# Patient Record
Sex: Female | Born: 1990 | Race: Black or African American | Hispanic: No | Smoking: Former smoker
Health system: Southern US, Community
[De-identification: ages and names within clinical notes are randomized; demographics above are authoritative.]

## PROBLEM LIST (undated history)

## (undated) DIAGNOSIS — Q249 Congenital malformation of heart, unspecified: Secondary | ICD-10-CM

## (undated) DIAGNOSIS — F419 Anxiety disorder, unspecified: Secondary | ICD-10-CM

## (undated) HISTORY — PX: OTHER SURGICAL HISTORY: SHX169

## (undated) NOTE — *Deleted (*Deleted)
  [facility]   MRN: [id number] DOB: Jun 01, 1990  Subjective:   Alison Reyes is a 59 y.o. female presenting for ***  No current facility-administered medications for this encounter.  Current Outpatient Medications:  .  cyclobenzaprine (FLEXERIL) 10 MG tablet, Take 1 tablet (10 mg total) by mouth 2 (two) times daily as needed for muscle spasms., Disp: 20 tablet, Rfl: 0 .  diphenhydrAMINE (BENADRYL) 25 MG tablet, Take 25 mg by mouth as needed (for allergic reactions). , Disp: , Rfl:  .  hydrocortisone cream 1 %, Apply 1 application topically as needed for itching., Disp: , Rfl:  .  hydrOXYzine (ATARAX/VISTARIL) 25 MG tablet, Take 0.5-1 tablets (12.5-25 mg total) by mouth every 8 (eight) hours as needed for itching., Disp: 30 tablet, Rfl: 0 .  ibuprofen (ADVIL,MOTRIN) 200 MG tablet, Take 600 mg by mouth every 8 (eight) hours as needed for moderate pain. , Disp: , Rfl:  .  ketoconazole (NIZORAL) 2 % cream, Apply 1 application topically daily., Disp: 30 g, Rfl: 0 .  methocarbamol (ROBAXIN) 500 MG tablet, Take 1 tablet (500 mg total) by mouth 2 (two) times daily., Disp: 20 tablet, Rfl: 0 .  naproxen (NAPROSYN) 500 MG tablet, Take 1 tablet (500 mg total) by mouth 2 (two) times daily with a meal. As needed for pain, Disp: 20 tablet, Rfl: 0 .  olopatadine (PATANOL) 0.1 % ophthalmic solution, Place 1 drop into the left eye 2 (two) times daily as needed for allergies (itching)., Disp: 5 mL, Rfl: 12 .  ondansetron (ZOFRAN-ODT) 8 MG disintegrating tablet, Take 1 tablet (8 mg total) by mouth every 8 (eight) hours as needed for nausea or vomiting., Disp: 15 tablet, Rfl: 0 .  polyethylene glycol powder (MIRALAX) 17 GM/SCOOP powder, Use 1-2 capfuls in 8 ounces of liquid up to 3 times a day until you are having regular bowel movements., Disp: 255 g, Rfl: 0 .  trimethoprim-polymyxin b (POLYTRIM) ophthalmic solution, Place 1-2 drops into the left eye every 6 (six) hours., Disp: 10 mL, Rfl:  0   Allergies  Allergen Reactions  . Benzocaine Anaphylaxis and Swelling    Throat swells  Throat swells  . Benzocaine-Benzalkonium Cl Swelling  . Carbamide Peroxide Swelling  . Prednisone Hives  . Nickel Rash    Past Medical History:  Diagnosis Date  . Anxiety      No past surgical history on file.  Family History  Problem Relation Age of Onset  . Diabetes Maternal Grandmother   . Diabetes Paternal Grandmother   . Diabetes Maternal Aunt   . Healthy Mother   . Healthy Father     Social History   Tobacco Use  . Smoking status: Light Tobacco Smoker    Types: Cigarettes  . Smokeless tobacco: Never Used  Substance Use Topics  . Alcohol use: Not Currently  . Drug use: Yes    Types: Marijuana    Comment: vicodin    ROS   Objective:   Vitals: There were no vitals taken for this visit.  Physical Exam  No results found for this or any previous visit (from the past 24 hour(s)).  Assessment and Plan :   PDMP not reviewed this encounter.  No diagnosis found.

---
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
## 2014-06-17 ENCOUNTER — Telehealth: Payer: Self-pay | Admitting: *Deleted

## 2014-06-17 NOTE — Telephone Encounter (Signed)
Error

## 2014-12-26 ENCOUNTER — Encounter (HOSPITAL_COMMUNITY): Payer: Self-pay | Admitting: *Deleted

## 2014-12-26 ENCOUNTER — Emergency Department (HOSPITAL_COMMUNITY)
Admission: EM | Admit: 2014-12-26 | Discharge: 2014-12-26 | Disposition: A | Discharge status: Home / Self Care | Payer: Self-pay | Attending: Emergency Medicine | Admitting: Emergency Medicine

## 2014-12-26 DIAGNOSIS — Z720 Tobacco use: Secondary | ICD-10-CM | POA: Insufficient documentation

## 2014-12-26 DIAGNOSIS — L03211 Cellulitis of face: Secondary | ICD-10-CM

## 2014-12-26 DIAGNOSIS — K130 Diseases of lips: Secondary | ICD-10-CM | POA: Insufficient documentation

## 2014-12-26 DIAGNOSIS — R634 Abnormal weight loss: Secondary | ICD-10-CM | POA: Insufficient documentation

## 2014-12-26 DIAGNOSIS — R220 Localized swelling, mass and lump, head: Secondary | ICD-10-CM

## 2014-12-26 LAB — CBG MONITORING, ED: Glucose-Capillary: 92 mg/dL (ref 65–99)

## 2014-12-26 MED ORDER — NAPROXEN 500 MG PO TABS: 500.0000 mg | ORAL_TABLET | Freq: Two times a day (BID) | ORAL | Status: DC | PRN

## 2014-12-26 MED ORDER — CEPHALEXIN 500 MG PO CAPS: ORAL_CAPSULE | ORAL | Status: DC

## 2014-12-26 MED ORDER — SULFAMETHOXAZOLE-TRIMETHOPRIM 800-160 MG PO TABS: 1.0000 | ORAL_TABLET | Freq: Two times a day (BID) | ORAL | Status: DC

## 2014-12-26 NOTE — Discharge Instructions (Signed)
Stop picking at your facial acne/white heads. Use a exfoliant face scrub to help with acne. Keep wound clean and dry. Apply warm compresses to affected area throughout the day, and use ice to help with pain/swelling. Take antibiotics until it is finished. Take naprosyn and tylenol as directed, as needed for pain. STOP SMOKING! Followup with [facility] in 2-3 days for wound recheck and ongoing medical care. Monitor area for signs of infection to include, but not limited to: increasing pain, spreading redness, drainage/pus, worsening swelling, or fevers. Return to emergency department for emergent changing or worsening symptoms.   Cellulitis Cellulitis is an infection of the skin and the tissue beneath it. The infected area is usually red and tender. Cellulitis occurs most often in the arms and lower legs.  CAUSES  Cellulitis is caused by bacteria that enter the skin through cracks or cuts in the skin. The most common types of bacteria that cause cellulitis are staphylococci and streptococci. SIGNS AND SYMPTOMS   Redness and warmth.  Swelling.  Tenderness or pain.  Fever. DIAGNOSIS  Your health care provider can usually determine what is wrong based on a physical exam. Blood tests may also be done. TREATMENT  Treatment usually involves taking an antibiotic medicine. HOME CARE INSTRUCTIONS   Take your antibiotic medicine as directed by your health care provider. Finish the antibiotic even if you start to feel better.  Keep the infected arm or leg elevated to reduce swelling.  Apply a warm cloth to the affected area up to 4 times per day to relieve pain.  Take medicines only as directed by your health care provider.  Keep all follow-up visits as directed by your health care provider. SEEK MEDICAL CARE IF:   You notice red streaks coming from the infected area.  Your red area gets larger or turns dark in color.  Your bone or joint underneath the infected  area becomes painful after the skin has healed.  Your infection returns in the same area or another area.  You notice a swollen bump in the infected area.  You develop new symptoms.  You have a fever. SEEK IMMEDIATE MEDICAL CARE IF:   You feel very sleepy.  You develop vomiting or diarrhea.  You have a general ill feeling (malaise) with muscle aches and pains.   This information is not intended to replace advice given to you by your health care provider. Make sure you discuss any questions you have with your health care provider.   Document Released: 11/25/2004 Document Revised: 11/06/2014 Document Reviewed: 05/03/2011 Elsevier Interactive Patient Education 2016 [employer].  Smoking Cessation, Tips for Success If you are ready to quit smoking, congratulations! You have chosen to help yourself be healthier. Cigarettes bring nicotine, tar, carbon monoxide, and other irritants into your body. Your lungs, heart, and blood vessels will be able to work better without these poisons. There are many different ways to quit smoking. Nicotine gum, nicotine patches, a nicotine inhaler, or nicotine nasal spray can help with physical craving. Hypnosis, support groups, and medicines help break the habit of smoking. WHAT THINGS CAN I DO TO MAKE QUITTING EASIER?  Here are some tips to help you quit for good:  Pick a date when you will quit smoking completely. Tell all of your friends and family about your plan to quit on that date.  Do not try to slowly cut down on the number of cigarettes you are smoking. Pick a quit date and quit smoking  completely starting on that day.  Throw away all cigarettes.   Clean and remove all ashtrays from your home, work, and car.  On a card, write down your reasons for quitting. Carry the card with you and read it when you get the urge to smoke.  Cleanse your body of nicotine. Drink enough water and fluids to keep your urine clear or pale yellow. Do this after  quitting to flush the nicotine from your body.  Learn to predict your moods. Do not let a bad situation be your excuse to have a cigarette. Some situations in your life might tempt you into wanting a cigarette.  Never have "just one" cigarette. It leads to wanting another and another. Remind yourself of your decision to quit.  Change habits associated with smoking. If you smoked while driving or when feeling stressed, try other activities to replace smoking. Stand up when drinking your coffee. Brush your teeth after eating. Sit in a different chair when you read the paper. Avoid alcohol while trying to quit, and try to drink fewer caffeinated beverages. Alcohol and caffeine may urge you to smoke.  Avoid foods and drinks that can trigger a desire to smoke, such as sugary or spicy foods and alcohol.  Ask people who smoke not to smoke around you.  Have something planned to do right after eating or having a cup of coffee. For example, plan to take a walk or exercise.  Try a relaxation exercise to calm you down and decrease your stress. Remember, you may be tense and nervous for the first 2 weeks after you quit, but this will pass.  Find new activities to keep your hands busy. Play with a pen, coin, or rubber band. Doodle or draw things on paper.  Brush your teeth right after eating. This will help cut down on the craving for the taste of tobacco after meals. You can also try mouthwash.   Use oral substitutes in place of cigarettes. Try using lemon drops, carrots, cinnamon sticks, or chewing gum. Keep them handy so they are available when you have the urge to smoke.  When you have the urge to smoke, try deep breathing.  Designate your home as a nonsmoking area.  If you are a heavy smoker, ask your health care provider about a prescription for nicotine chewing gum. It can ease your withdrawal from nicotine.  Reward yourself. Set aside the cigarette money you save and buy yourself something  nice.  Look for support from others. Join a support group or smoking cessation program. Ask someone at home or at work to help you with your plan to quit smoking.  Always ask yourself, "Do I need this cigarette or is this just a reflex?" Tell yourself, "Today, I choose not to smoke," or "I do not want to smoke." You are reminding yourself of your decision to quit.  Do not replace cigarette smoking with electronic cigarettes (commonly called e-cigarettes). The safety of e-cigarettes is unknown, and some may contain harmful chemicals.  If you relapse, do not give up! Plan ahead and think about what you will do the next time you get the urge to smoke. HOW WILL I FEEL WHEN I QUIT SMOKING? You may have symptoms of withdrawal because your body is used to nicotine (the addictive substance in cigarettes). You may crave cigarettes, be irritable, feel very hungry, cough often, get headaches, or have difficulty concentrating. The withdrawal symptoms are only temporary. They are strongest when you first quit but will  go away within 10-14 days. When withdrawal symptoms occur, stay in control. Think about your reasons for quitting. Remind yourself that these are signs that your body is healing and getting used to being without cigarettes. Remember that withdrawal symptoms are easier to treat than the major diseases that smoking can cause.  Even after the withdrawal is over, expect periodic urges to smoke. However, these cravings are generally short lived and will go away whether you smoke or not. Do not smoke! WHAT RESOURCES ARE AVAILABLE TO HELP ME QUIT SMOKING? Your health care provider can direct you to community resources or hospitals for support, which may include:  Group support.  Education.  Hypnosis.  Therapy.   This information is not intended to replace advice given to you by your health care provider. Make sure you discuss any questions you have with your health care provider.   Document  Released: 11/14/2003 Document Revised: 03/08/2014 Document Reviewed: 08/03/2012 Elsevier Interactive Patient Education [employer] Elsevier Inc.

## 2014-12-26 NOTE — ED Provider Notes (Signed)
CSN: [id number]     Arrival date & time 12/26/14  4098 History   First MD Initiated Contact with Patient 12/26/14 0827     Chief Complaint  Patient presents with  . Lip Infection      (Consider location/radiation/quality/duration/timing/severity/associated sxs/prior Treatment) HPI Comments: Alison Reyes is a 24 y.o. otherwise healthy female, who presents to the ED with complaints of left lip swelling 2 days with associated pain, erythema, warmth, and yellow drainage. She reports that she gets whiteheads on her lower lip and chin, and regularly picks at them, and 2 days ago one of the areas began to swell. She describes the pain is 8/10 constant throbbing nonradiating pain worse with touching the area and unrelieved with ibuprofen. She states she is concerned that maybe she has diabetes because her family members have diabetes and when she gets a cut or sore it takes a long time to heal, as well as having a 20 pound weight loss in the last year. She denies any fevers, chest pain, shortness breath, difficulty swallowing, drooling, and, drainage or dental pain, neck pain or swelling, abdominal pain, nausea, vomiting, diarrhea constipation, dysuria, hematuria, numbness, tingling, polydipsia, or polyuria.  Patient is a 24 y.o. female presenting with abscess. The history is provided by the patient. No language interpreter was used.  Abscess Location:  Face Facial abscess location:  Lip Abscess quality: draining, painful, redness and warmth   Red streaking: no   Duration:  2 days Progression:  Unchanged Pain details:    Quality:  Throbbing   Severity:  Moderate   Duration:  2 days   Timing:  Constant   Progression:  Unchanged Chronicity:  Recurrent Context: skin injury   Relieved by:  Nothing Worsened by:  Draining/squeezing Ineffective treatments:  NSAIDs Associated symptoms: no fever, no nausea and no vomiting     History reviewed. No pertinent past medical history. History  reviewed. No pertinent past surgical history. Family History  Problem Relation Age of Onset  . Diabetes Maternal Grandmother   . Diabetes Paternal Grandmother   . Diabetes Maternal Aunt    Social History  Substance Use Topics  . Smoking status: Light Tobacco Smoker  . Smokeless tobacco: None  . Alcohol Use: No   OB History    No data available     Review of Systems  Constitutional: Positive for unexpected weight change (lost 20# in 8yr). Negative for fever and chills.  HENT: Positive for facial swelling (lip bumps). Negative for dental problem and drooling.   Respiratory: Negative for shortness of breath.   Cardiovascular: Negative for chest pain.  Gastrointestinal: Negative for nausea, vomiting, abdominal pain, diarrhea and constipation.  Endocrine: Negative for polydipsia and polyuria.  Genitourinary: Negative for dysuria, hematuria, vaginal bleeding and vaginal discharge.  Musculoskeletal: Negative for myalgias, arthralgias and neck pain.  Skin: Positive for wound.  Allergic/Immunologic: Negative for immunocompromised state.  Neurological: Negative for weakness and numbness.  Psychiatric/Behavioral: Negative for confusion.   10 Systems reviewed and are negative for acute change except as noted in the HPI.    Allergies  Review of patient's allergies indicates no known allergies.  Home Medications   Prior to Admission medications   Not on File   BP 128/60 mmHg  Pulse 79  Temp(Src) 98.2 F (36.8 C) (Oral)  SpO2 100%  LMP 12/24/2014 (Exact Date) Physical Exam  Constitutional: She is oriented to person, place, and time. Vital signs are normal. She appears well-developed and well-nourished.  Non-toxic appearance. No  distress.  Afebrile, nontoxic, NAD  HENT:  Head: Normocephalic and atraumatic.  Mouth/Throat: Uvula is midline, oropharynx is clear and moist and mucous membranes are normal. Oral lesions present. No trismus in the jaw. No dental abscesses or uvula  swelling.    ~144mm circular sore to L lower lip with mild swelling, tenderness, and slight induration without fluctuance, erythema, or warmth. Several white heads to chin. No drainage from sore. No trismus. Dentitia without abscess.   Eyes: Conjunctivae and EOM are normal. Right eye exhibits no discharge. Left eye exhibits no discharge.  Neck: Normal range of motion. Neck supple.  Cardiovascular: Normal rate.   Pulmonary/Chest: Effort normal. No respiratory distress.  Abdominal: Normal appearance. She exhibits no distension.  Musculoskeletal: Normal range of motion.  Lymphadenopathy:       Head (right side): No submandibular and no tonsillar adenopathy present.       Head (left side): Submandibular adenopathy present. No tonsillar adenopathy present.    She has cervical adenopathy.  Shotty cervical LAD which is nonTTP, slightly tender L submandibular reactive LAD  Neurological: She is alert and oriented to person, place, and time. She has normal strength. No sensory deficit.  Skin: Skin is warm, dry and intact. Lesion noted. No rash noted.  L lower lip sore as noted above  Psychiatric: She has a normal mood and affect. Her behavior is normal.  Nursing note and vitals reviewed.   ED Course  Procedures (including critical care time) Labs Review Labs Reviewed  CBG MONITORING, ED    Imaging Review No results found. I have personally reviewed and evaluated these images and lab results as part of my medical decision-making.   EKG Interpretation None      MDM   Final diagnoses:  Lip pain  Lip swelling  Cellulitis of face  Weight loss  Tobacco abuse    24 y.o. female here with L lower lip infection after she repeatedly picked at a white head that she perpetually has on her chin. Area without erythema or warmth, no fluctuance, mild induration and swelling and tenderness, appears to be a sore that is beginning to be secondarily infected. Doubt need for I&D. Will start on abx, and  have her f/up with [facility]. Pt also concerned that she could have diabetes due to wt loss over the last yr, no polyuria/polydipsia, will check CBG but discussed that she would need formal hgb A1C testing to definitively diagnose this in the event that it's an equivocal number.  8:53 AM CBG 92 (fasting). Tylenol/motrin for pain, ice discussed, smoking cessation discussed. Keflex/bactrim rx given. F/up with [facility] in 3 days. I explained the diagnosis and have given explicit precautions to return to the ER including for any other new or worsening symptoms. The patient understands and accepts the medical plan as it's been dictated and I have answered their questions. Discharge instructions concerning home care and prescriptions have been given. The patient is STABLE and is discharged to home in good condition.  BP 128/60 mmHg  Pulse 79  Temp(Src) 98.2 F (36.8 C) (Oral)  Ht 5\' 6"  (1.676 m)  Wt 131 lb (59.421 kg)  BMI 21.15 kg/m2  SpO2 100%  LMP 12/24/2014 (Exact Date)  Meds ordered this encounter  Medications  . cephALEXin (KEFLEX) 500 MG capsule    Sig: 2 caps po bid x 7 days    Dispense:  28 capsule    Refill:  0    Order Specific Question:  Supervising Provider  Answer:  MILLER, BRIAN [[id number]]  . sulfamethoxazole-trimethoprim (BACTRIM DS,SEPTRA DS) 800-160 MG tablet    Sig: Take 1 tablet by mouth 2 (two) times daily.    Dispense:  14 tablet    Refill:  0    Order Specific Question:  Supervising Provider    Answer:  MILLER, BRIAN [[id number]]  . naproxen (NAPROSYN) 500 MG tablet    Sig: Take 1 tablet (500 mg total) by mouth 2 (two) times daily as needed for mild pain, moderate pain or headache (TAKE WITH MEALS.).    Dispense:  20 tablet    Refill:  0    Order Specific Question:  Supervising Provider    Answer:  Eber Hong [address] [place], PA-C 12/26/14 6045  Nelva Nay, MD 12/27/14 [phone]

## 2014-12-26 NOTE — ED Notes (Signed)
PA at bedside.

## 2014-12-26 NOTE — ED Notes (Signed)
Pt reports she had acne on around her lower lip. Pt stuck 2 places on lip edge with sewing needle she sterilized 2 days ago. Now has a white sore on left bottom lip. Smaller sore on right bottom lip. Pain 8/10. Denies fever. Some redness around lip site, pt just pulled off 2 bandaids from left wound area. Some swelling noted to left lower lip.   Pt is concerned she has diabetes, maternal grandmother, paternal grandmother, maternal aunt all have diabetes. Pt thinks this because cuts/sores take a long time to heal and pt has lost 20 pounds in the last year without trying. Went from 154 pounds to 130 pounds.

## 2017-11-05 ENCOUNTER — Emergency Department (HOSPITAL_COMMUNITY)
Admission: EM | Admit: 2017-11-05 | Discharge: 2017-11-05 | Disposition: A | Discharge status: Home / Self Care | Payer: Self-pay | Attending: Emergency Medicine | Admitting: Emergency Medicine

## 2017-11-05 ENCOUNTER — Encounter (HOSPITAL_COMMUNITY): Payer: Self-pay | Admitting: Emergency Medicine

## 2017-11-05 ENCOUNTER — Emergency Department (HOSPITAL_COMMUNITY): Payer: Self-pay

## 2017-11-05 ENCOUNTER — Other Ambulatory Visit: Payer: Self-pay

## 2017-11-05 DIAGNOSIS — F17210 Nicotine dependence, cigarettes, uncomplicated: Secondary | ICD-10-CM | POA: Insufficient documentation

## 2017-11-05 DIAGNOSIS — R55 Syncope and collapse: Secondary | ICD-10-CM | POA: Insufficient documentation

## 2017-11-05 DIAGNOSIS — Z7982 Long term (current) use of aspirin: Secondary | ICD-10-CM | POA: Insufficient documentation

## 2017-11-05 DIAGNOSIS — Z79899 Other long term (current) drug therapy: Secondary | ICD-10-CM | POA: Insufficient documentation

## 2017-11-05 LAB — I-STAT CHEM 8, ED
BUN: 20 mg/dL (ref 6–20)
Calcium, Ion: 1.04 mmol/L — ABNORMAL LOW (ref 1.15–1.40)
Chloride: 106 mmol/L (ref 98–111)
Creatinine, Ser: 0.7 mg/dL (ref 0.44–1.00)
Glucose, Bld: 111 mg/dL — ABNORMAL HIGH (ref 70–99)
HCT: 42 % (ref 36.0–46.0)
Hemoglobin: 14.3 g/dL (ref 12.0–15.0)
Potassium: 5.2 mmol/L — ABNORMAL HIGH (ref 3.5–5.1)
Sodium: 138 mmol/L (ref 135–145)
TCO2: 24 mmol/L (ref 22–32)

## 2017-11-05 LAB — I-STAT BETA HCG BLOOD, ED (MC, WL, AP ONLY): I-stat hCG, quantitative: <5 m[IU]/mL (ref <5)

## 2017-11-05 LAB — ETHANOL: Alcohol, Ethyl (B): 23 mg/dL — ABNORMAL HIGH (ref <10)

## 2017-11-05 MED ORDER — LORAZEPAM 2 MG/ML IJ SOLN
1.0000 mg | Freq: Once | INTRAMUSCULAR | Status: AC | PRN
  Administered 2017-11-05: 1 mg via INTRAVENOUS
  Filled 2017-11-05: qty 1

## 2017-11-05 MED ORDER — SODIUM CHLORIDE 0.9 % IV BOLUS
1000.0000 mL | Freq: Once | INTRAVENOUS | Status: AC
  Administered 2017-11-05: 1000 mL via INTRAVENOUS

## 2017-11-05 MED ORDER — GADOBUTROL 1 MMOL/ML IV SOLN
7.5000 mL | Freq: Once | INTRAVENOUS | Status: AC | PRN
  Administered 2017-11-05: 6 mL via INTRAVENOUS

## 2017-11-05 MED ORDER — METOCLOPRAMIDE HCL 5 MG/ML IJ SOLN
10.0000 mg | Freq: Once | INTRAMUSCULAR | Status: AC
  Administered 2017-11-05: 10 mg via INTRAVENOUS
  Filled 2017-11-05: qty 2
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
## 2017-11-05 NOTE — Discharge Instructions (Addendum)
Do not drive until cleared by Cardiology.  Drink plenty of fluids. Your studies today were re-assuring.

## 2017-11-05 NOTE — ED Notes (Signed)
Patient transported to MRI 

## 2017-11-05 NOTE — ED Triage Notes (Signed)
EMS  20g in rt. Wrist , given Zofran 4 mg. IV .

## 2018-04-13 ENCOUNTER — Emergency Department (HOSPITAL_COMMUNITY)
Admission: EM | Admit: 2018-04-13 | Discharge: 2018-04-14 | Disposition: A | Discharge status: Home / Self Care | Payer: Self-pay | Attending: Emergency Medicine | Admitting: Emergency Medicine

## 2018-04-13 ENCOUNTER — Encounter (HOSPITAL_COMMUNITY): Payer: Self-pay | Admitting: Emergency Medicine

## 2018-04-13 DIAGNOSIS — T424X1A Poisoning by benzodiazepines, accidental (unintentional), initial encounter: Secondary | ICD-10-CM | POA: Insufficient documentation

## 2018-04-13 DIAGNOSIS — F17210 Nicotine dependence, cigarettes, uncomplicated: Secondary | ICD-10-CM | POA: Insufficient documentation

## 2018-04-13 DIAGNOSIS — T50901A Poisoning by unspecified drugs, medicaments and biological substances, accidental (unintentional), initial encounter: Secondary | ICD-10-CM

## 2018-04-13 DIAGNOSIS — Z7982 Long term (current) use of aspirin: Secondary | ICD-10-CM | POA: Insufficient documentation

## 2018-04-13 DIAGNOSIS — Z79899 Other long term (current) drug therapy: Secondary | ICD-10-CM | POA: Insufficient documentation

## 2018-04-13 HISTORY — DX: Anxiety disorder, unspecified: F41.9

## 2018-04-13 LAB — CBC WITH DIFFERENTIAL/PLATELET
ABS IMMATURE GRANULOCYTES: 0.1 10*3/uL — AB (ref 0.00–0.07)
Basophils Absolute: 0.1 10*3/uL (ref 0.0–0.1)
Basophils Relative: 0 %
Eosinophils Absolute: 0 10*3/uL (ref 0.0–0.5)
Eosinophils Relative: 0 %
HCT: 41.7 % (ref 36.0–46.0)
HEMOGLOBIN: 13.8 g/dL (ref 12.0–15.0)
Immature Granulocytes: 1 %
Lymphocytes Relative: 20 %
Lymphs Abs: 3.3 10*3/uL (ref 0.7–4.0)
MCH: 32.5 pg (ref 26.0–34.0)
MCHC: 33.1 g/dL (ref 30.0–36.0)
MCV: 98.1 fL (ref 80.0–100.0)
Monocytes Absolute: 0.9 10*3/uL (ref 0.1–1.0)
Monocytes Relative: 5 %
Neutro Abs: 12.2 10*3/uL — ABNORMAL HIGH (ref 1.7–7.7)
Neutrophils Relative %: 74 %
Platelets: 357 10*3/uL (ref 150–400)
RBC: 4.25 MIL/uL (ref 3.87–5.11)
RDW: 11.6 % (ref 11.5–15.5)
WBC: 16.6 10*3/uL — ABNORMAL HIGH (ref 4.0–10.5)
nRBC: 0 % (ref 0.0–0.2)

## 2018-04-13 LAB — COMPREHENSIVE METABOLIC PANEL
ALK PHOS: 41 U/L (ref 38–126)
ALT: 34 U/L (ref 0–44)
AST: 53 U/L — ABNORMAL HIGH (ref 15–41)
Albumin: 4.5 g/dL (ref 3.5–5.0)
Anion gap: 20 — ABNORMAL HIGH (ref 5–15)
BUN: 12 mg/dL (ref 6–20)
CO2: 19 mmol/L — AB (ref 22–32)
Calcium: 9 mg/dL (ref 8.9–10.3)
Chloride: 100 mmol/L (ref 98–111)
Creatinine, Ser: 1.23 mg/dL — ABNORMAL HIGH (ref 0.44–1.00)
GFR calc Af Amer: >60 mL/min (ref >60)
GFR calc non Af Amer: >60 mL/min (ref >60)
Glucose, Bld: 197 mg/dL — ABNORMAL HIGH (ref 70–99)
Potassium: 3.3 mmol/L — ABNORMAL LOW (ref 3.5–5.1)
Sodium: 139 mmol/L (ref 135–145)
Total Bilirubin: 1.2 mg/dL (ref 0.3–1.2)
Total Protein: 8 g/dL (ref 6.5–8.1)

## 2018-04-13 LAB — SALICYLATE LEVEL: Salicylate Lvl: <7 mg/dL (ref 2.8–30.0)

## 2018-04-13 LAB — ETHANOL: Alcohol, Ethyl (B): <10 mg/dL (ref <10)

## 2018-04-13 LAB — I-STAT BETA HCG BLOOD, ED (MC, WL, AP ONLY): I-stat hCG, quantitative: <5 m[IU]/mL (ref <5)

## 2018-04-13 LAB — RAPID URINE DRUG SCREEN, HOSP PERFORMED
AMPHETAMINES: NOT DETECTED
Barbiturates: NOT DETECTED
Benzodiazepines: POSITIVE — AB
Cocaine: NOT DETECTED
Opiates: NOT DETECTED
Tetrahydrocannabinol: POSITIVE — AB

## 2018-04-13 LAB — ACETAMINOPHEN LEVEL: Acetaminophen (Tylenol), Serum: <10 ug/mL — ABNORMAL LOW (ref 10–30)

## 2018-04-13 MED ORDER — ONDANSETRON HCL 4 MG/2ML IJ SOLN
4.0000 mg | Freq: Once | INTRAMUSCULAR | Status: AC
  Administered 2018-04-14: 4 mg via INTRAVENOUS
  Filled 2018-04-13: qty 2

## 2018-04-13 MED ORDER — LACTATED RINGERS IV BOLUS
1000.0000 mL | Freq: Once | INTRAVENOUS | Status: AC
  Administered 2018-04-13: 1000 mL via INTRAVENOUS

## 2018-04-13 MED ORDER — ONDANSETRON HCL 4 MG/2ML IJ SOLN
4.0000 mg | Freq: Once | INTRAMUSCULAR | Status: AC
  Administered 2018-04-13: 4 mg via INTRAVENOUS
  Filled 2018-04-13: qty 2

## 2018-04-13 MED ORDER — NALOXONE HCL 0.4 MG/ML IJ SOLN: 0.4000 mg | INTRAMUSCULAR | Status: DC | PRN

## 2018-04-13 NOTE — ED Notes (Signed)
EDP explained tests results and plan of care to pt.  
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
## 2018-04-13 NOTE — ED Provider Notes (Signed)
[facility] EMERGENCY DEPARTMENT Provider Note   CSN: [id number] Arrival date & time: 04/13/18  2034     History   Chief Complaint Chief Complaint  Patient presents with  . Possible Xanax Overdose    HPI Alison Reyes is a 28 y.o. female.   Drug Overdose  This is a new problem. The current episode started less than 1 hour ago. The problem occurs rarely. The problem has been resolved. Pertinent negatives include no chest pain, no abdominal pain, no headaches and no shortness of breath. Nothing aggravates the symptoms. Relieved by: Narcan 2 mg. She has tried nothing for the symptoms. The treatment provided no relief.    Past Medical History:  Diagnosis Date  . Anxiety     There are no active problems to display for this patient.   History reviewed. No pertinent surgical history.   OB History   No obstetric history on file.      Home Medications    Prior to Admission medications   Medication Sig Start Date End Date Taking? Authorizing Provider  aspirin EC 325 MG tablet Take 325 mg by mouth daily.   Yes [provider]  diphenhydrAMINE (BENADRYL) 25 MG tablet Take 25 mg by mouth as needed (for allergic reactions).    Yes [provider]  hydrocortisone cream 1 % Apply 1 application topically as needed for itching.   Yes [provider]  aspirin EC 81 MG tablet Take 81 mg by mouth daily.    [provider]  cephALEXin (KEFLEX) 500 MG capsule 2 caps po bid x 7 days Patient not taking: Reported on 04/13/2018 12/26/14   Street, [place], PA-C  ibuprofen (ADVIL,MOTRIN) 200 MG tablet Take 600 mg by mouth every 8 (eight) hours as needed for moderate pain.     [provider]  naproxen (NAPROSYN) 500 MG tablet Take 1 tablet (500 mg total) by mouth 2 (two) times daily as needed for mild pain, moderate pain or headache (TAKE WITH MEALS.). Patient not taking: Reported on 04/13/2018 12/26/14   Street, [place],  PA-C  sulfamethoxazole-trimethoprim (BACTRIM DS,SEPTRA DS) 800-160 MG tablet Take 1 tablet by mouth 2 (two) times daily. Patient not taking: Reported on 04/13/2018 12/26/14   Street, [place], PA-C    Family History Family History  Problem Relation Age of Onset  . Diabetes Maternal Grandmother   . Diabetes Paternal Grandmother   . Diabetes Maternal Aunt     Social History Social History   Tobacco Use  . Smoking status: Light Tobacco Smoker  . Smokeless tobacco: Never Used  Substance Use Topics  . Alcohol use: No  . Drug use: No     Allergies   Benzocaine; Carbamide peroxide; Prednisone; and Nickel   Review of Systems Review of Systems  Constitutional: Negative for chills and fever.  HENT: Negative for ear pain and sore throat.   Eyes: Negative for pain and visual disturbance.  Respiratory: Negative for cough and shortness of breath.   Cardiovascular: Negative for chest pain and palpitations.  Gastrointestinal: Negative for abdominal pain and vomiting.  Genitourinary: Negative for dysuria and hematuria.  Musculoskeletal: Negative for arthralgias and back pain.  Skin: Negative for color change and rash.  Neurological: Negative for seizures, syncope and headaches.  All other systems reviewed and are negative.    Physical Exam Updated Vital Signs BP 127/83   Pulse 71   Resp 17   SpO2 100%   Physical Exam Vitals signs and nursing note reviewed.  Constitutional:      General: She is not in acute distress.    Appearance: She is well-developed.     Comments: Patient resting comfortably, no acute distress on arrival.  HENT:     Head: Normocephalic and atraumatic.  Eyes:     Conjunctiva/sclera: Conjunctivae normal.  Neck:     Musculoskeletal: Neck supple.  Cardiovascular:     Rate and Rhythm: Normal rate and regular rhythm.     Heart sounds: No murmur.  Pulmonary:     Effort: Pulmonary effort is normal. No respiratory distress.     Breath sounds: Normal  breath sounds.  Abdominal:     Palpations: Abdomen is soft.     Tenderness: There is no abdominal tenderness.  Musculoskeletal: Normal range of motion.  Skin:    General: Skin is warm and dry.  Neurological:     General: No focal deficit present.     Mental Status: She is alert and oriented to person, place, and time. Mental status is at baseline.     Cranial Nerves: No cranial nerve deficit.     Sensory: No sensory deficit.     Motor: No weakness.     Coordination: Coordination normal.     Gait: Gait normal.     Deep Tendon Reflexes: Reflexes normal.     Comments: Normal neurological exam, GCS 15.  Psychiatric:        Mood and Affect: Mood normal.     Comments: Denies SI, HI, AVH      ED Treatments / Results  Labs (all labs ordered are listed, but only abnormal results are displayed) Labs Reviewed  RAPID URINE DRUG SCREEN, HOSP PERFORMED - Abnormal; Notable for the following components:      Result Value   Benzodiazepines POSITIVE (*)    Tetrahydrocannabinol POSITIVE (*)    All other components within normal limits  COMPREHENSIVE METABOLIC PANEL - Abnormal; Notable for the following components:   Potassium 3.3 (*)    CO2 19 (*)    Glucose, Bld 197 (*)    Creatinine, Ser 1.23 (*)    AST 53 (*)    Anion gap 20 (*)    All other components within normal limits  CBC WITH DIFFERENTIAL/PLATELET - Abnormal; Notable for the following components:   WBC 16.6 (*)    Neutro Abs 12.2 (*)    Abs Immature Granulocytes 0.10 (*)    All other components within normal limits  ACETAMINOPHEN LEVEL - Abnormal; Notable for the following components:   Acetaminophen (Tylenol), Serum <10 (*)    All other components within normal limits  SALICYLATE LEVEL  ETHANOL  I-STAT BETA HCG BLOOD, ED ([facility], [facility], [facility] ONLY)    EKG EKG Interpretation  Date/Time:  Thursday April 13 2018 20:38:25 EST Ventricular Rate:  92 PR Interval:    QRS Duration: 92 QT Interval:  367 QTC Calculation: 454 R  Axis:   51 Text Interpretation:  Sinus rhythm similar to previous Confirmed by Frederick Peers [phone]) on 04/13/2018 9:10:13 PM   Radiology No results found.  Procedures Procedures (including critical care time)  Medications Ordered in ED Medications  naloxone (NARCAN) injection 0.4 mg (has no administration in time range)  ondansetron (ZOFRAN) injection 4 mg (4 mg Intravenous Given 04/13/18 2125)  lactated ringers bolus 1,000 mL (0 mLs Intravenous Stopped 04/14/18 0003)  ondansetron (ZOFRAN) injection 4 mg (4 mg Intravenous Given 04/14/18 0005)     Initial Impression / Assessment and Plan / ED Course  I  have reviewed the triage vital signs and the nursing notes.  Pertinent labs & imaging results that were available during my care of the patient were reviewed by me and considered in my medical decision making (see chart for details).      28 year old female significant past medical history listed above who presents after accidental overdose.  Patient indicates that she was going to take some Xanax that she buys off the street for anxiety, took 1 of these pills they became somnolent.  EMS was called at that time, patient was apneic on arrival, BVM was used to assist ventilations and patient was given 2 of Narcan with reversal of symptoms.  Patient on arrival hemodynamically stable, GCS 15.  No signs of trauma.  Patient endorsed story as stated above, denies SI, HI, AVH.  Laboratory studies do not indicate any acute etiology.  Otherwise normal.  Patient observed here in the emergency department without returning of symptoms, no necessity for repeat Narcan administration.  Patient indicates that she feels safe living with her girlfriend at home.  Denies any abuse.  Patient discharged in stable condition with stable vital signs and to her loved one's care.  Patient given strict return precautions.  Will give follow-up information.  Patient in agreement with this plan.  The above care was  discussed and agreed upon by my attending physician.  Final Clinical Impressions(s) / ED Diagnoses   Final diagnoses:  Accidental drug overdose, initial encounter    ED Discharge Orders    None       Dahlia Clientchsenbein, Luana Tatro, MD 04/14/18 0007    Clarene DukeLittle, Ambrose Finlandachel Morgan, MD 04/16/18 [phone]

## 2018-05-07 ENCOUNTER — Emergency Department (HOSPITAL_COMMUNITY): Payer: Self-pay

## 2018-05-07 ENCOUNTER — Emergency Department (HOSPITAL_COMMUNITY)
Admission: EM | Admit: 2018-05-07 | Discharge: 2018-05-07 | Disposition: A | Discharge status: Home / Self Care | Payer: Self-pay | Attending: Emergency Medicine | Admitting: Emergency Medicine

## 2018-05-07 ENCOUNTER — Encounter (HOSPITAL_COMMUNITY): Payer: Self-pay | Admitting: Emergency Medicine

## 2018-05-07 ENCOUNTER — Other Ambulatory Visit: Payer: Self-pay

## 2018-05-07 DIAGNOSIS — F17200 Nicotine dependence, unspecified, uncomplicated: Secondary | ICD-10-CM | POA: Insufficient documentation

## 2018-05-07 DIAGNOSIS — Y929 Unspecified place or not applicable: Secondary | ICD-10-CM | POA: Insufficient documentation

## 2018-05-07 DIAGNOSIS — Z79899 Other long term (current) drug therapy: Secondary | ICD-10-CM | POA: Insufficient documentation

## 2018-05-07 DIAGNOSIS — Y939 Activity, unspecified: Secondary | ICD-10-CM | POA: Insufficient documentation

## 2018-05-07 DIAGNOSIS — Y998 Other external cause status: Secondary | ICD-10-CM | POA: Insufficient documentation

## 2018-05-07 DIAGNOSIS — S0083XA Contusion of other part of head, initial encounter: Secondary | ICD-10-CM | POA: Insufficient documentation

## 2018-05-07 DIAGNOSIS — Z7982 Long term (current) use of aspirin: Secondary | ICD-10-CM | POA: Insufficient documentation

## 2018-05-07 MED ORDER — CYCLOBENZAPRINE HCL 10 MG PO TABS: 10.0000 mg | ORAL_TABLET | Freq: Two times a day (BID) | ORAL | 0 refills | Status: DC | PRN

## 2018-05-07 MED ORDER — NAPROXEN 500 MG PO TABS: 500.0000 mg | ORAL_TABLET | Freq: Two times a day (BID) | ORAL | 0 refills | Status: DC

## 2018-05-07 MED ORDER — ARTIFICIAL TEARS OPHTHALMIC OINT
1.0000 "application " | TOPICAL_OINTMENT | Freq: Once | OPHTHALMIC | Status: AC
  Administered 2018-05-07: 1 via OPHTHALMIC
  Filled 2018-05-07 (×2): qty 3.5

## 2018-05-07 MED ORDER — HYDROCODONE-ACETAMINOPHEN 5-325 MG PO TABS: 1.0000 | ORAL_TABLET | ORAL | 0 refills | Status: DC | PRN

## 2018-05-07 MED ORDER — HYDROCODONE-ACETAMINOPHEN 5-325 MG PO TABS
1.0000 | ORAL_TABLET | ORAL | Status: AC
  Administered 2018-05-07: 1 via ORAL
  Filled 2018-05-07: qty 1
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
## 2018-05-07 NOTE — Discharge Instructions (Addendum)
Take the medications for pain, follow-up with an orthopedic in the eye doctor for further treatment

## 2018-05-07 NOTE — ED Notes (Signed)
Patient verbalizes understanding of discharge instructions. Opportunity for questioning and answers were provided. Pt to be discharged after CSI is finished documenting and after artificial tears have been given.
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
## 2018-05-07 NOTE — ED Notes (Signed)
Pt left the ED before she was discharged by RN. Pt returned to the ED for her eye drops she was supposed to get when she was discharged. Readmitted Pt to the ED to give her eye drops and now unable to locate pt at this time. Sort RN made aware and asked to bring Pt back to room 5 if she comes back.

## 2018-05-07 NOTE — ED Notes (Signed)
Pt returned and given eye drop by [facility] RN

## 2018-05-07 NOTE — ED Notes (Signed)
Message sent to pharmacy to please verify artificial tears.

## 2019-06-02 ENCOUNTER — Emergency Department (HOSPITAL_COMMUNITY)
Admission: EM | Admit: 2019-06-02 | Discharge: 2019-06-02 | Payer: Self-pay | Attending: Emergency Medicine | Admitting: Emergency Medicine

## 2019-06-02 ENCOUNTER — Other Ambulatory Visit: Payer: Self-pay

## 2019-06-02 ENCOUNTER — Encounter (HOSPITAL_COMMUNITY): Payer: Self-pay

## 2019-06-02 DIAGNOSIS — R112 Nausea with vomiting, unspecified: Secondary | ICD-10-CM | POA: Insufficient documentation

## 2019-06-02 DIAGNOSIS — Z720 Tobacco use: Secondary | ICD-10-CM | POA: Insufficient documentation

## 2019-06-02 DIAGNOSIS — F17200 Nicotine dependence, unspecified, uncomplicated: Secondary | ICD-10-CM | POA: Insufficient documentation

## 2019-06-02 DIAGNOSIS — F419 Anxiety disorder, unspecified: Secondary | ICD-10-CM | POA: Insufficient documentation

## 2019-06-02 DIAGNOSIS — Z7982 Long term (current) use of aspirin: Secondary | ICD-10-CM | POA: Insufficient documentation

## 2019-06-02 DIAGNOSIS — Z79899 Other long term (current) drug therapy: Secondary | ICD-10-CM | POA: Insufficient documentation

## 2019-06-02 DIAGNOSIS — T40601A Poisoning by unspecified narcotics, accidental (unintentional), initial encounter: Secondary | ICD-10-CM | POA: Insufficient documentation

## 2019-06-02 LAB — I-STAT BETA HCG BLOOD, ED (MC, WL, AP ONLY): I-stat hCG, quantitative: <5 m[IU]/mL (ref <5)

## 2019-06-02 LAB — COMPREHENSIVE METABOLIC PANEL
ALT: 19 U/L (ref 0–44)
AST: 24 U/L (ref 15–41)
Albumin: 5.1 g/dL — ABNORMAL HIGH (ref 3.5–5.0)
Alkaline Phosphatase: 47 U/L (ref 38–126)
Anion gap: 12 (ref 5–15)
BUN: 17 mg/dL (ref 6–20)
CO2: 25 mmol/L (ref 22–32)
Calcium: 9.2 mg/dL (ref 8.9–10.3)
Chloride: 102 mmol/L (ref 98–111)
Creatinine, Ser: 0.98 mg/dL (ref 0.44–1.00)
GFR calc Af Amer: >60 mL/min (ref >60)
GFR calc non Af Amer: >60 mL/min (ref >60)
Glucose, Bld: 179 mg/dL — ABNORMAL HIGH (ref 70–99)
Potassium: 3.4 mmol/L — ABNORMAL LOW (ref 3.5–5.1)
Sodium: 139 mmol/L (ref 135–145)
Total Bilirubin: 1 mg/dL (ref 0.3–1.2)
Total Protein: 8.3 g/dL — ABNORMAL HIGH (ref 6.5–8.1)

## 2019-06-02 LAB — CBC WITH DIFFERENTIAL/PLATELET
Abs Immature Granulocytes: 0.06 10*3/uL (ref 0.00–0.07)
Basophils Absolute: 0 10*3/uL (ref 0.0–0.1)
Basophils Relative: 0 %
Eosinophils Absolute: 0 10*3/uL (ref 0.0–0.5)
Eosinophils Relative: 0 %
HCT: 41.8 % (ref 36.0–46.0)
Hemoglobin: 13.7 g/dL (ref 12.0–15.0)
Immature Granulocytes: 0 %
Lymphocytes Relative: 13 %
Lymphs Abs: 2.1 10*3/uL (ref 0.7–4.0)
MCH: 32.3 pg (ref 26.0–34.0)
MCHC: 32.8 g/dL (ref 30.0–36.0)
MCV: 98.6 fL (ref 80.0–100.0)
Monocytes Absolute: 0.9 10*3/uL (ref 0.1–1.0)
Monocytes Relative: 5 %
Neutro Abs: 13.2 10*3/uL — ABNORMAL HIGH (ref 1.7–7.7)
Neutrophils Relative %: 82 %
Platelets: 283 10*3/uL (ref 150–400)
RBC: 4.24 MIL/uL (ref 3.87–5.11)
RDW: 11.7 % (ref 11.5–15.5)
WBC: 16.3 10*3/uL — ABNORMAL HIGH (ref 4.0–10.5)
nRBC: 0.1 % (ref 0.0–0.2)

## 2019-06-02 LAB — LIPASE, BLOOD: Lipase: 18 U/L (ref 11–51)

## 2019-06-02 MED ORDER — POLYETHYLENE GLYCOL 3350 17 GM/SCOOP PO POWD: ORAL | 0 refills | Status: DC

## 2019-06-02 MED ORDER — ONDANSETRON 4 MG PO TBDP
4.0000 mg | ORAL_TABLET | Freq: Once | ORAL | Status: AC
  Administered 2019-06-02: 23:00:00 4 mg via ORAL
  Filled 2019-06-02: qty 1
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
## 2019-06-02 NOTE — ED Notes (Signed)
Pt ripped off all her monitor cords. Pt is talking on the phone loudly. Will continue to monitor pt.
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
## 2019-06-02 NOTE — Discharge Instructions (Addendum)
Your work-up is not complete, there may be an underlying cause for your symptoms that has not been diagnosed at this time.  However, as your heart rate is improving and you are able to keep fluids down, I do not believe you need to sign out against medical advice.  However if your symptoms worsen or you require further evaluation, return to the emergency room.  Follow-up with your primary care doctor as needed for further evaluation management of your anxiety and vomiting.

## 2019-06-02 NOTE — Discharge Instructions (Addendum)
Please return for any problem.  Follow-up with primary care provider as instructed.  Do not use narcotics in an unsafe manner.  Unsafe use of narcotics could result in your death.

## 2019-06-02 NOTE — ED Notes (Signed)
Pt is sitting up in bed crying and talking loudly. NAD noted. Will continue to monitor pt.
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
## 2019-06-02 NOTE — ED Notes (Signed)
Pt refusing EKG, further blood work. States she wants to go to [facility] and finish this process. Pt agrees to Zofran and fluids. Pt ambulating restroom for urine sample.

## 2019-06-03 LAB — ETHANOL: Alcohol, Ethyl (B): <10 mg/dL (ref <10)

## 2019-06-04 ENCOUNTER — Emergency Department (HOSPITAL_COMMUNITY)
Admission: EM | Admit: 2019-06-04 | Discharge: 2019-06-04 | Disposition: A | Discharge status: Home / Self Care | Payer: No Typology Code available for payment source | Attending: Emergency Medicine | Admitting: Emergency Medicine

## 2019-06-04 ENCOUNTER — Other Ambulatory Visit: Payer: Self-pay

## 2019-06-04 ENCOUNTER — Encounter (HOSPITAL_COMMUNITY): Payer: Self-pay | Admitting: Emergency Medicine

## 2019-06-04 DIAGNOSIS — M545 Low back pain, unspecified: Secondary | ICD-10-CM

## 2019-06-04 DIAGNOSIS — R110 Nausea: Secondary | ICD-10-CM | POA: Diagnosis not present

## 2019-06-04 DIAGNOSIS — R739 Hyperglycemia, unspecified: Secondary | ICD-10-CM | POA: Insufficient documentation

## 2019-06-04 DIAGNOSIS — F17210 Nicotine dependence, cigarettes, uncomplicated: Secondary | ICD-10-CM | POA: Insufficient documentation

## 2019-06-04 DIAGNOSIS — M25521 Pain in right elbow: Secondary | ICD-10-CM | POA: Insufficient documentation

## 2019-06-04 LAB — CBG MONITORING, ED: Glucose-Capillary: 100 mg/dL — ABNORMAL HIGH (ref 70–99)

## 2019-06-04 MED ORDER — METHOCARBAMOL 500 MG PO TABS: 500.0000 mg | ORAL_TABLET | Freq: Two times a day (BID) | ORAL | 0 refills | Status: DC

## 2019-06-04 MED ORDER — ONDANSETRON 4 MG PO TBDP: 4.0000 mg | ORAL_TABLET | Freq: Three times a day (TID) | ORAL | 0 refills | Status: DC | PRN

## 2019-06-04 MED ORDER — ONDANSETRON 4 MG PO TBDP
4.0000 mg | ORAL_TABLET | Freq: Once | ORAL | Status: AC
  Administered 2019-06-04: 17:00:00 4 mg via ORAL
  Filled 2019-06-04: qty 1
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
## 2019-06-04 NOTE — Discharge Instructions (Signed)
Tylenol for pain.  Follow up for evaluation as scheduled
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
## 2019-07-17 ENCOUNTER — Other Ambulatory Visit: Payer: Self-pay

## 2019-07-17 ENCOUNTER — Encounter (HOSPITAL_COMMUNITY): Payer: Self-pay

## 2019-07-17 ENCOUNTER — Ambulatory Visit (HOSPITAL_COMMUNITY)
Admission: EM | Admit: 2019-07-17 | Discharge: 2019-07-17 | Disposition: A | Discharge status: Home / Self Care | Payer: Self-pay | Attending: Urgent Care | Admitting: Urgent Care

## 2019-07-17 DIAGNOSIS — R112 Nausea with vomiting, unspecified: Secondary | ICD-10-CM | POA: Insufficient documentation

## 2019-07-17 DIAGNOSIS — R350 Frequency of micturition: Secondary | ICD-10-CM | POA: Insufficient documentation

## 2019-07-17 DIAGNOSIS — R300 Dysuria: Secondary | ICD-10-CM | POA: Insufficient documentation

## 2019-07-17 DIAGNOSIS — L259 Unspecified contact dermatitis, unspecified cause: Secondary | ICD-10-CM | POA: Insufficient documentation

## 2019-07-17 DIAGNOSIS — B354 Tinea corporis: Secondary | ICD-10-CM | POA: Insufficient documentation

## 2019-07-17 DIAGNOSIS — R109 Unspecified abdominal pain: Secondary | ICD-10-CM | POA: Insufficient documentation

## 2019-07-17 DIAGNOSIS — R1030 Lower abdominal pain, unspecified: Secondary | ICD-10-CM | POA: Insufficient documentation

## 2019-07-17 LAB — POCT URINALYSIS DIP (DEVICE)
Bilirubin Urine: NEGATIVE
Glucose, UA: NEGATIVE mg/dL
Nitrite: NEGATIVE
Protein, ur: NEGATIVE mg/dL
Specific Gravity, Urine: 1.025 (ref 1.005–1.030)
Urobilinogen, UA: 0.2 mg/dL (ref 0.0–1.0)
pH: 7.5 (ref 5.0–8.0)

## 2019-07-17 MED ORDER — KETOCONAZOLE 2 % EX CREA: 1.0000 "application " | TOPICAL_CREAM | Freq: Every day | CUTANEOUS | 0 refills | Status: DC

## 2019-07-17 MED ORDER — HYDROXYZINE HCL 25 MG PO TABS: 12.5000 mg | ORAL_TABLET | Freq: Three times a day (TID) | ORAL | 0 refills | Status: DC | PRN

## 2019-07-17 MED ORDER — ONDANSETRON 8 MG PO TBDP: 8.0000 mg | ORAL_TABLET | Freq: Three times a day (TID) | ORAL | 0 refills | Status: DC | PRN

## 2019-07-17 NOTE — Discharge Instructions (Signed)
Please just use Tylenol at a dose of 500mg -650mg  once every 6 hours as needed for your aches, pains, fevers. Do not use any nonsteroidal anti-inflammatories (NSAIDs) like ibuprofen, Motrin, naproxen, Aleve, etc. which are all available over-the-counter.  If your belly pain persists despite taking the medications I am prescribing then please go to the ER as you may end up needing a CT scan of your abdomen/pelvis.

## 2019-07-17 NOTE — ED Provider Notes (Signed)
[place]   MRN: [id number] DOB: 11-08-1990  Subjective:   Alison Reyes is a 29 y.o. female presenting for acute onset this morning of nausea, vomiting, belly pain, dysuria, urinary frequency.  She admits that she used the wrong soap over the genital area yesterday.  She also thinks that she ate something wrong, ate takeout from a [employer].  She needs a note for work.  She also has had a rash for weeks now on her face, and upper part of her chest, right side of her neck.  Patient has not tried any medications for relief.  She is not been evaluated for rash.  She is primarily wanting a note for work today.  Denies drinking alcohol, smokes marijuana.  Does not hydrate well with water.  Lab review demonstrates history of leukocytosis and abnormal metabolic panels.  She does not have a PCP, has not had work-up and states that she does not remember anybody reviewing labs with her.  Patient is sexually active, has sex with females only.  LMP was 07/03/2019. Patient was tested for COVID 19 last week. Was negative.   No current facility-administered medications for this encounter.  Current Outpatient Medications:  .  aspirin EC 325 MG tablet, Take 325 mg by mouth daily., Disp: , Rfl:  .  aspirin EC 81 MG tablet, Take 81 mg by mouth daily., Disp: , Rfl:  .  cephALEXin (KEFLEX) 500 MG capsule, 2 caps po bid x 7 days (Patient not taking: Reported on 04/13/2018), Disp: 28 capsule, Rfl: 0 .  cyclobenzaprine (FLEXERIL) 10 MG tablet, Take 1 tablet (10 mg total) by mouth 2 (two) times daily as needed for muscle spasms., Disp: 20 tablet, Rfl: 0 .  diphenhydrAMINE (BENADRYL) 25 MG tablet, Take 25 mg by mouth as needed (for allergic reactions). , Disp: , Rfl:  .  HYDROcodone-acetaminophen (NORCO/VICODIN) 5-325 MG tablet, Take 1 tablet by mouth every 4 (four) hours as needed., Disp: 12 tablet, Rfl: 0 .  hydrocortisone cream 1 %, Apply 1 application topically as needed for itching., Disp: ,  Rfl:  .  ibuprofen (ADVIL,MOTRIN) 200 MG tablet, Take 600 mg by mouth every 8 (eight) hours as needed for moderate pain. , Disp: , Rfl:  .  methocarbamol (ROBAXIN) 500 MG tablet, Take 1 tablet (500 mg total) by mouth 2 (two) times daily., Disp: 20 tablet, Rfl: 0 .  naproxen (NAPROSYN) 500 MG tablet, Take 1 tablet (500 mg total) by mouth 2 (two) times daily with a meal. As needed for pain, Disp: 20 tablet, Rfl: 0 .  ondansetron (ZOFRAN ODT) 4 MG disintegrating tablet, Take 1 tablet (4 mg total) by mouth every 8 (eight) hours as needed for nausea or vomiting., Disp: 20 tablet, Rfl: 0 .  polyethylene glycol powder (MIRALAX) 17 GM/SCOOP powder, Use 1-2 capfuls in 8 ounces of liquid up to 3 times a day until you are having regular bowel movements., Disp: 255 g, Rfl: 0 .  sulfamethoxazole-trimethoprim (BACTRIM DS,SEPTRA DS) 800-160 MG tablet, Take 1 tablet by mouth 2 (two) times daily. (Patient not taking: Reported on 04/13/2018), Disp: 14 tablet, Rfl: 0   Allergies  Allergen Reactions  . Benzocaine Anaphylaxis and Swelling    Throat swells  Throat swells  . Benzocaine-Benzalkonium Cl Swelling  . Carbamide Peroxide Swelling  . Prednisone Hives  . Nickel Rash    Past Medical History:  Diagnosis Date  . Anxiety      History reviewed. No pertinent surgical history.  Family History  Problem Relation Age of Onset  . Diabetes Maternal Grandmother   . Diabetes Paternal Grandmother   . Diabetes Maternal Aunt   . Healthy Mother   . Healthy Father     Social History   Tobacco Use  . Smoking status: Light Tobacco Smoker    Types: Cigarettes  . Smokeless tobacco: Never Used  Substance Use Topics  . Alcohol use: Not Currently  . Drug use: Yes    Types: Marijuana    Comment: vicodin    Review of Systems  Constitutional: Negative for fever and malaise/fatigue.  HENT: Negative for congestion, ear pain, sinus pain and sore throat.   Eyes: Negative for discharge and redness.  Respiratory:  Negative for cough, hemoptysis, shortness of breath and wheezing.   Cardiovascular: Negative for chest pain.  Gastrointestinal: Positive for abdominal pain, nausea and vomiting. Negative for blood in stool, constipation and diarrhea.  Genitourinary: Positive for dysuria and frequency. Negative for flank pain, hematuria and urgency.  Musculoskeletal: Negative for myalgias.  Skin: Positive for rash.  Neurological: Negative for dizziness, weakness and headaches.  Psychiatric/Behavioral: Negative for depression and substance abuse.    Objective:   Vitals: BP (!) 111/45 (BP Location: Right Arm)   Pulse 77   Temp 98.6 F (37 C) (Oral)   Resp 17   Wt 135 lb (61.2 kg)   LMP 07/03/2019   SpO2 96%   BMI 21.14 kg/m   Physical Exam Constitutional:      General: She is not in acute distress.    Appearance: Normal appearance. She is well-developed and normal weight. She is not ill-appearing, toxic-appearing or diaphoretic.  HENT:     Head: Normocephalic and atraumatic.      Right Ear: External ear normal.     Left Ear: External ear normal.     Nose: Nose normal.     Mouth/Throat:     Mouth: Mucous membranes are moist.  Eyes:     General: No scleral icterus.       Right eye: No discharge.        Left eye: No discharge.     Extraocular Movements: Extraocular movements intact.     Pupils: Pupils are equal, round, and reactive to light.  Cardiovascular:     Rate and Rhythm: Normal rate and regular rhythm.     Heart sounds: Normal heart sounds. No murmur. No friction rub. No gallop.   Pulmonary:     Effort: Pulmonary effort is normal. No respiratory distress.     Breath sounds: Normal breath sounds. No stridor. No wheezing, rhonchi or rales.  Abdominal:     General: Bowel sounds are normal. There is no distension.     Palpations: Abdomen is soft. There is no mass.     Tenderness: There is abdominal tenderness (left and lower sided). There is no right CVA tenderness, left CVA  tenderness, guarding or rebound.  Skin:    General: Skin is warm and dry.     Coloration: Skin is not pale.     Findings: No rash.  Neurological:     General: No focal deficit present.     Mental Status: She is alert and oriented to person, place, and time.  Psychiatric:        Mood and Affect: Mood normal.        Behavior: Behavior normal.        Thought Content: Thought content normal.        Judgment: Judgment normal.  Results for orders placed or performed during the hospital encounter of 07/17/19 (from the past 24 hour(s))  POCT urinalysis dip (device)     Status: Abnormal   Collection Time: 07/17/19  5:19 PM  Result Value Ref Range   Glucose, UA NEGATIVE NEGATIVE mg/dL   Bilirubin Urine NEGATIVE NEGATIVE   Ketones, ur TRACE (A) NEGATIVE mg/dL   Specific Gravity, Urine 1.025 1.005 - 1.030   Hgb urine dipstick TRACE (A) NEGATIVE   pH 7.5 5.0 - 8.0   Protein, ur NEGATIVE NEGATIVE mg/dL   Urobilinogen, UA 0.2 0.0 - 1.0 mg/dL   Nitrite NEGATIVE NEGATIVE   Leukocytes,Ua SMALL (A) NEGATIVE    Assessment and Plan :   PDMP not reviewed this encounter.  1. Nausea and vomiting, intractability of vomiting not specified, unspecified vomiting type   2. Lower abdominal pain   3. Left sided abdominal pain   4. Dysuria   5. Urinary frequency   6. Tinea corporis   7. Contact dermatitis, unspecified contact dermatitis type, unspecified trigger     Differential is extensive but we will focusing on managing supportively for possible gastroenteritis.  Urine culture, STI testing pending.  Hydroxyzine for itching, suspect contact dermatitis related to using a soap she is not used to.  Use ketoconazole cream over rash of the face, neck and upper part of her chest.  Recommended patient start hydrating very well, needs to have more work-up with PCP given her abnormal lab.  Patient is to contact PCP providers, information given to the patient.  She refused COVID-19 test. Counseled patient  on potential for adverse effects with medications prescribed/recommended today, ER and return-to-clinic precautions discussed, patient verbalized understanding.    Wallis Bamberg, [place] 07/17/19 1806

## 2019-07-17 NOTE — ED Triage Notes (Addendum)
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

## 2019-07-18 ENCOUNTER — Telehealth (HOSPITAL_COMMUNITY): Payer: Self-pay | Admitting: Orthopedic Surgery

## 2019-07-18 LAB — CERVICOVAGINAL ANCILLARY ONLY
Bacterial Vaginitis (gardnerella): POSITIVE — AB
Candida Glabrata: NEGATIVE
Candida Vaginitis: NEGATIVE
Chlamydia: NEGATIVE
Comment: NEGATIVE
Comment: NEGATIVE
Comment: NEGATIVE
Comment: NEGATIVE
Comment: NEGATIVE
Comment: NORMAL
Neisseria Gonorrhea: NEGATIVE
Trichomonas: NEGATIVE

## 2019-07-18 LAB — URINE CULTURE: Culture: NO GROWTH

## 2019-07-18 MED ORDER — METRONIDAZOLE 500 MG PO TABS: 500.0000 mg | ORAL_TABLET | Freq: Two times a day (BID) | ORAL | 0 refills | Status: DC

## 2019-07-23 ENCOUNTER — Telehealth (HOSPITAL_COMMUNITY): Payer: Self-pay | Admitting: Orthopedic Surgery

## 2019-10-19 ENCOUNTER — Encounter (HOSPITAL_COMMUNITY): Payer: Self-pay

## 2019-10-19 ENCOUNTER — Other Ambulatory Visit: Payer: Self-pay

## 2019-10-19 ENCOUNTER — Ambulatory Visit (HOSPITAL_COMMUNITY)
Admission: EM | Admit: 2019-10-19 | Discharge: 2019-10-19 | Disposition: A | Discharge status: Home / Self Care | Payer: No Typology Code available for payment source | Attending: Family Medicine | Admitting: Family Medicine

## 2019-10-19 DIAGNOSIS — S0502XA Injury of conjunctiva and corneal abrasion without foreign body, left eye, initial encounter: Secondary | ICD-10-CM

## 2019-10-19 DIAGNOSIS — H1132 Conjunctival hemorrhage, left eye: Secondary | ICD-10-CM

## 2019-10-19 MED ORDER — POLYMYXIN B-TRIMETHOPRIM 10000-0.1 UNIT/ML-% OP SOLN: 1.0000 [drp] | Freq: Four times a day (QID) | OPHTHALMIC | 0 refills | Status: DC

## 2019-10-19 MED ORDER — OLOPATADINE HCL 0.1 % OP SOLN: 1.0000 [drp] | Freq: Two times a day (BID) | OPHTHALMIC | 12 refills | Status: DC | PRN

## 2019-10-19 NOTE — Discharge Instructions (Signed)
Please use polytrim drops 4 times daily for 10 days Olopatadine only if itching Warm compresses Follow up with ophthalmology if persisting Daily cetirizine/zyrtec for throat irritation

## 2019-10-19 NOTE — ED Triage Notes (Signed)
Pt presents with left eye swelling & irritation since yesterday while being at work.

## 2019-10-20 NOTE — ED Provider Notes (Signed)
[facility]    CSN: [id number] Arrival date & time: 10/19/19  1524      History   Chief Complaint Chief Complaint  Patient presents with  . Eye Problem    HPI Alison Reyes is a 29 y.o. female presenting today for evaluation of left eye irritation and swelling.  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  Became very irritated and began watering.  She has noticed some redness and pain developed since.  Itching has subsided.  She has tried over-the-counter eye rinses and saline without relief.  She denies contact use.  Denies changes in vision.  HPI  Past Medical History:  Diagnosis Date  . Anxiety     There are no problems to display for this patient.   History reviewed. No pertinent surgical history.  OB History   No obstetric history on file.      Home Medications    Prior to Admission medications   Medication Sig Start Date End Date Taking? Authorizing Provider  cyclobenzaprine (FLEXERIL) 10 MG tablet Take 1 tablet (10 mg total) by mouth 2 (two) times daily as needed for muscle spasms. 05/07/18   Linwood Dibbles, MD  diphenhydrAMINE (BENADRYL) 25 MG tablet Take 25 mg by mouth as needed (for allergic reactions).     [provider]  hydrocortisone cream 1 % Apply 1 application topically as needed for itching.    [provider]  hydrOXYzine (ATARAX/VISTARIL) 25 MG tablet Take 0.5-1 tablets (12.5-25 mg total) by mouth every 8 (eight) hours as needed for itching. 07/17/19   Wallis Bamberg, PA-C  ibuprofen (ADVIL,MOTRIN) 200 MG tablet Take 600 mg by mouth every 8 (eight) hours as needed for moderate pain.     [provider]  ketoconazole (NIZORAL) 2 % cream Apply 1 application topically daily. 07/17/19   Wallis Bamberg, PA-C  methocarbamol (ROBAXIN) 500 MG tablet Take 1 tablet (500 mg total) by mouth 2 (two) times daily. 06/04/19   Elson Areas, PA-C  naproxen  (NAPROSYN) 500 MG tablet Take 1 tablet (500 mg total) by mouth 2 (two) times daily with a meal. As needed for pain 05/07/18   Linwood Dibbles, MD  olopatadine (PATANOL) 0.1 % ophthalmic solution Place 1 drop into the left eye 2 (two) times daily as needed for allergies (itching). 10/19/19   Bellatrix Devonshire C, PA-C  ondansetron (ZOFRAN-ODT) 8 MG disintegrating tablet Take 1 tablet (8 mg total) by mouth every 8 (eight) hours as needed for nausea or vomiting. 07/17/19   Wallis Bamberg, PA-C  polyethylene glycol powder (MIRALAX) 17 GM/SCOOP powder Use 1-2 capfuls in 8 ounces of liquid up to 3 times a day until you are having regular bowel movements. 06/02/19   Caccavale, Sophia, PA-C  trimethoprim-polymyxin b (POLYTRIM) ophthalmic solution Place 1-2 drops into the left eye every 6 (six) hours. 10/19/19   Anyelo Mccue, Junius Creamer, PA-C    Family History Family History  Problem Relation Age of Onset  . Diabetes Maternal Grandmother   . Diabetes Paternal Grandmother   . Diabetes Maternal Aunt   . Healthy Mother   . Healthy Father     Social History Social History   Tobacco Use  . Smoking status: Light Tobacco Smoker    Types: Cigarettes  . Smokeless tobacco: Never Used  Substance Use Topics  . Alcohol use: Not Currently  . Drug use: Yes    Types: Marijuana  Comment: vicodin     Allergies   Benzocaine, Benzocaine-benzalkonium cl, Carbamide peroxide, Prednisone, and Nickel   Review of Systems Review of Systems  Constitutional: Negative for activity change, appetite change, chills, fatigue and fever.  HENT: Positive for rhinorrhea. Negative for congestion, ear pain, sinus pressure, sore throat and trouble swallowing.   Eyes: Positive for photophobia, discharge, redness and itching. Negative for visual disturbance.  Respiratory: Negative for cough, chest tightness and shortness of breath.   Cardiovascular: Negative for chest pain.  Gastrointestinal: Negative for abdominal pain, diarrhea, nausea and  vomiting.  Musculoskeletal: Negative for myalgias.  Skin: Negative for rash.  Neurological: Negative for dizziness, light-headedness and headaches.     Physical Exam Triage Vital Signs ED Triage Vitals  Enc Vitals Group     BP 10/19/19 1559 117/61     Pulse Rate 10/19/19 1559 69     Resp 10/19/19 1559 20     Temp 10/19/19 1559 98.5 F (36.9 C)     Temp Source 10/19/19 1559 Oral     SpO2 10/19/19 1559 97 %     Weight --      Height --      Head Circumference --      Peak Flow --      Pain Score 10/19/19 1558 9     Pain Loc --      Pain Edu? --      Excl. in GC? --    No data found.  Updated Vital Signs BP 117/61 (BP Location: Right Arm)   Pulse 69   Temp 98.5 F (36.9 C) (Oral)   Resp 20   LMP 09/26/2019   SpO2 97%   Visual Acuity Right Eye Distance:   20/25 Left Eye Distance:  20/30 Bilateral Distance:  20/25  Right Eye Near:   Left Eye Near:    Bilateral Near:     Physical Exam Vitals and nursing note reviewed.  Constitutional:      Appearance: She is well-developed.     Comments: No acute distress  HENT:     Head: Normocephalic and atraumatic.     Nose: Nose normal.  Eyes:     Extraocular Movements: Extraocular movements intact.     Conjunctiva/sclera: Conjunctivae normal.     Pupils: Pupils are equal, round, and reactive to light.     Comments: Left outer area of conjunctive a appears erythematous with mild ecchymosis and notable subconjunctival hemorrhage Anterior chamber clear  fluorescein staining uptake of the lateral aspect of conjunctiva  Cardiovascular:     Rate and Rhythm: Normal rate.  Pulmonary:     Effort: Pulmonary effort is normal. No respiratory distress.  Abdominal:     General: There is no distension.  Musculoskeletal:        General: Normal range of motion.     Cervical back: Neck supple.  Skin:    General: Skin is warm and dry.  Neurological:     Mental Status: She is alert and oriented to person, place, and time.       UC Treatments / Results  Labs (all labs ordered are listed, but only abnormal results are displayed) Labs Reviewed - No data to display  EKG   Radiology No results found.  Procedures Procedures (including critical care time)  Medications Ordered in UC Medications - No data to display  Initial Impression / Assessment and Plan / UC Course  I have reviewed the triage vital signs and the nursing notes.  Pertinent labs &  imaging results that were available during my care of the patient were reviewed by me and considered in my medical decision making (see chart for details).     Appears to have conjunctival abrasion with fluorescein uptake, possible relation to underlying allergic conjunctivitis with itching.  Providing Polytrim and olopatadine, patient only use olopatadine if developing significant itching.  Otherwise we will treat as abrasion and warm compresses.  Close monitoring.  Follow-up with ophthalmology if symptoms persisting.  Discussed strict return precautions. Patient verbalized understanding and is agreeable with plan.  Final Clinical Impressions(s) / UC Diagnoses   Final diagnoses:  Abrasion of left conjunctiva, initial encounter  Subconjunctival hemorrhage of left eye     Discharge Instructions     Please use polytrim drops 4 times daily for 10 days Olopatadine only if itching Warm compresses Follow up with ophthalmology if persisting Daily cetirizine/zyrtec for throat irritation   ED Prescriptions    Medication Sig Dispense Auth. Provider   trimethoprim-polymyxin b (POLYTRIM) ophthalmic solution Place 1-2 drops into the left eye every 6 (six) hours. 10 mL Cesar Alf C, PA-C   olopatadine (PATANOL) 0.1 % ophthalmic solution Place 1 drop into the left eye 2 (two) times daily as needed for allergies (itching). 5 mL Harjit Leider, [place] C, PA-C     PDMP not reviewed this encounter.   Wilian Kwong, [place] C, PA-C 10/20/19 1011

## 2019-12-18 ENCOUNTER — Other Ambulatory Visit: Payer: Self-pay

## 2019-12-18 ENCOUNTER — Ambulatory Visit (HOSPITAL_COMMUNITY)
Admission: EM | Admit: 2019-12-18 | Discharge: 2019-12-18 | Disposition: A | Discharge status: Home / Self Care | Payer: Self-pay

## 2019-12-18 NOTE — ED Notes (Signed)
Patient called on  Phone and in lobby, no answer.

## 2019-12-18 NOTE — ED Notes (Signed)
Pt called 2X on phone with no answer

## 2020-01-18 ENCOUNTER — Emergency Department (HOSPITAL_COMMUNITY): Payer: No Typology Code available for payment source

## 2020-01-18 ENCOUNTER — Emergency Department (HOSPITAL_COMMUNITY)
Admission: EM | Admit: 2020-01-18 | Discharge: 2020-01-18 | Disposition: A | Discharge status: Home / Self Care | Payer: No Typology Code available for payment source | Attending: Emergency Medicine | Admitting: Emergency Medicine

## 2020-01-18 ENCOUNTER — Other Ambulatory Visit: Payer: Self-pay

## 2020-01-18 ENCOUNTER — Encounter (HOSPITAL_COMMUNITY): Payer: Self-pay

## 2020-01-18 DIAGNOSIS — S161XXA Strain of muscle, fascia and tendon at neck level, initial encounter: Secondary | ICD-10-CM | POA: Insufficient documentation

## 2020-01-18 DIAGNOSIS — Z87891 Personal history of nicotine dependence: Secondary | ICD-10-CM | POA: Insufficient documentation

## 2020-01-18 DIAGNOSIS — M79675 Pain in left toe(s): Secondary | ICD-10-CM | POA: Diagnosis not present

## 2020-01-18 HISTORY — DX: Congenital malformation of heart, unspecified: Q24.9

## 2020-01-18 MED ORDER — METHOCARBAMOL 500 MG PO TABS: 500.0000 mg | ORAL_TABLET | Freq: Three times a day (TID) | ORAL | 0 refills | Status: DC | PRN

## 2020-01-18 MED ORDER — OXYCODONE-ACETAMINOPHEN 5-325 MG PO TABS
1.0000 | ORAL_TABLET | Freq: Once | ORAL | Status: AC
  Administered 2020-01-18: 1 via ORAL
  Filled 2020-01-18: qty 1

## 2020-01-18 MED ORDER — METHOCARBAMOL 500 MG PO TABS
500.0000 mg | ORAL_TABLET | Freq: Once | ORAL | Status: AC
  Administered 2020-01-18: 500 mg via ORAL
  Filled 2020-01-18: qty 1

## 2020-01-18 NOTE — ED Notes (Signed)
Pt waiting for x-ray of toe before discharge.

## 2020-01-18 NOTE — ED Notes (Signed)
Pt to xray

## 2020-01-18 NOTE — ED Notes (Signed)
Pt. Documented in error see above note in chart. 
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
## 2020-01-18 NOTE — Discharge Instructions (Signed)
Take Tylenol Motrin for pain control.  Return to ER for chest pain difficulty in breathing or other new concerning symptom.  Take muscle relaxer as needed.
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
## 2020-01-18 NOTE — ED Notes (Signed)
Patient provided with heat packs for back pain

## 2020-01-22 ENCOUNTER — Ambulatory Visit (HOSPITAL_COMMUNITY)
Admission: EM | Admit: 2020-01-22 | Discharge: 2020-01-22 | Disposition: A | Discharge status: Home / Self Care | Payer: Self-pay | Attending: Urgent Care | Admitting: Urgent Care

## 2020-01-22 ENCOUNTER — Other Ambulatory Visit: Payer: Self-pay

## 2020-01-22 ENCOUNTER — Encounter (HOSPITAL_COMMUNITY): Payer: Self-pay

## 2020-01-22 DIAGNOSIS — T07XXXA Unspecified multiple injuries, initial encounter: Secondary | ICD-10-CM

## 2020-01-22 DIAGNOSIS — M545 Low back pain, unspecified: Secondary | ICD-10-CM

## 2020-01-22 DIAGNOSIS — M79674 Pain in right toe(s): Secondary | ICD-10-CM

## 2020-01-22 DIAGNOSIS — M25512 Pain in left shoulder: Secondary | ICD-10-CM

## 2020-01-22 MED ORDER — NAPROXEN 500 MG PO TABS: 500.0000 mg | ORAL_TABLET | Freq: Two times a day (BID) | ORAL | 0 refills | Status: DC

## 2020-01-22 MED ORDER — TIZANIDINE HCL 4 MG PO TABS: 4.0000 mg | ORAL_TABLET | Freq: Three times a day (TID) | ORAL | 0 refills | Status: DC | PRN
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
## 2020-01-22 NOTE — ED Triage Notes (Signed)
Pt presents with back pain x 5 days and toe pain. Pt states when she walks she feels shooting pain from her lower back to her right leg. She states that she has trouble moving her big toe and second toe on left foot. She states she went to the ED on 11/19 and there was not a report of any abnormal results from the X-ray that was done there.

## 2020-01-29 ENCOUNTER — Encounter (HOSPITAL_COMMUNITY): Payer: Self-pay

## 2020-01-29 ENCOUNTER — Ambulatory Visit (HOSPITAL_COMMUNITY)
Admission: EM | Admit: 2020-01-29 | Discharge: 2020-01-29 | Disposition: A | Discharge status: Home / Self Care | Payer: Self-pay

## 2020-01-29 ENCOUNTER — Other Ambulatory Visit: Payer: Self-pay

## 2020-01-29 NOTE — ED Triage Notes (Signed)
Pt reports dizziness, abdominal pain and nausea since 5 am today. Reports she had 3 of whisky or brandy, some friend gave to her. States the vomit was yellow with blood spots. Pt took Zofran 8 am today. Denies chest pain, sob, drugs use.

## 2020-03-28 ENCOUNTER — Encounter (HOSPITAL_COMMUNITY): Payer: Self-pay

## 2020-03-28 ENCOUNTER — Ambulatory Visit (HOSPITAL_COMMUNITY)
Admission: EM | Admit: 2020-03-28 | Discharge: 2020-03-28 | Disposition: A | Discharge status: Home / Self Care | Payer: Self-pay | Attending: Student | Admitting: Student

## 2020-03-28 ENCOUNTER — Other Ambulatory Visit: Payer: Self-pay

## 2020-03-28 ENCOUNTER — Ambulatory Visit (INDEPENDENT_AMBULATORY_CARE_PROVIDER_SITE_OTHER): Payer: Self-pay

## 2020-03-28 DIAGNOSIS — S92415A Nondisplaced fracture of proximal phalanx of left great toe, initial encounter for closed fracture: Secondary | ICD-10-CM

## 2020-03-28 DIAGNOSIS — M79675 Pain in left toe(s): Secondary | ICD-10-CM

## 2020-03-28 MED ORDER — KETOROLAC TROMETHAMINE 60 MG/2ML IM SOLN
60.0000 mg | Freq: Once | INTRAMUSCULAR | Status: AC
  Administered 2020-03-28: 60 mg via INTRAMUSCULAR

## 2020-03-28 MED ORDER — IBUPROFEN 800 MG PO TABS
800.0000 mg | ORAL_TABLET | Freq: Once | ORAL | Status: AC
  Administered 2020-03-28: 800 mg via ORAL

## 2020-03-28 MED ORDER — HYDROCODONE-ACETAMINOPHEN 5-325 MG PO TABS: 1.0000 | ORAL_TABLET | ORAL | 0 refills | Status: DC | PRN

## 2020-03-28 MED ORDER — IBUPROFEN 800 MG PO TABS
ORAL_TABLET | ORAL | Status: AC
  Filled 2020-03-28: qty 1

## 2020-03-28 MED ORDER — KETOROLAC TROMETHAMINE 60 MG/2ML IM SOLN
INTRAMUSCULAR | Status: AC
  Filled 2020-03-28: qty 2
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
## 2020-03-28 NOTE — ED Notes (Signed)
Fitted pt with ortho shoe size medium 2/2 no size small available in stock. Pt states shoe is tolerable and does not cause pain to top of toe/foot area. D/c via WC

## 2020-03-28 NOTE — ED Notes (Signed)
Pt denied Toradol injection. Reported to provider.

## 2020-03-28 NOTE — Discharge Instructions (Addendum)
-  Rest, ice, elevation, ibuprofen/vicodin for pain relief.  -Keep your boot on at all times when walking/driving -Make sure to not take vicodin before driving, drinking alcohol, operating machinery, etc.  -Call orthopedist (information below) and schedule an appointment with them for follow-up

## 2020-03-28 NOTE — ED Triage Notes (Signed)
Pt present left big toe pain with discoloration. Pt states that she tripped and fall. She injured her big toe before but today its painful and she cannot bear weight on that foot.
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
## 2020-04-20 ENCOUNTER — Encounter (HOSPITAL_COMMUNITY): Payer: Self-pay | Admitting: Emergency Medicine

## 2020-04-20 ENCOUNTER — Emergency Department (HOSPITAL_COMMUNITY)
Admission: EM | Admit: 2020-04-20 | Discharge: 2020-04-21 | Discharge status: Against Medical Advice | Payer: Self-pay | Attending: Emergency Medicine | Admitting: Emergency Medicine

## 2020-04-20 ENCOUNTER — Other Ambulatory Visit: Payer: Self-pay

## 2020-04-20 DIAGNOSIS — Z87891 Personal history of nicotine dependence: Secondary | ICD-10-CM | POA: Insufficient documentation

## 2020-04-20 DIAGNOSIS — T782XXA Anaphylactic shock, unspecified, initial encounter: Secondary | ICD-10-CM

## 2020-04-20 DIAGNOSIS — X58XXXA Exposure to other specified factors, initial encounter: Secondary | ICD-10-CM | POA: Insufficient documentation

## 2020-04-20 DIAGNOSIS — T7802XA Anaphylactic reaction due to shellfish (crustaceans), initial encounter: Secondary | ICD-10-CM | POA: Insufficient documentation

## 2020-04-20 LAB — I-STAT BETA HCG BLOOD, ED (MC, WL, AP ONLY): I-stat hCG, quantitative: <5 m[IU]/mL (ref <5)

## 2020-04-20 MED ORDER — DIPHENHYDRAMINE HCL 50 MG/ML IJ SOLN
25.0000 mg | Freq: Once | INTRAMUSCULAR | Status: AC
Start: 2020-04-20 — End: 2020-04-20
  Administered 2020-04-20: 25 mg via INTRAVENOUS
  Filled 2020-04-20: qty 1

## 2020-04-20 MED ORDER — EPINEPHRINE 0.3 MG/0.3ML IJ SOAJ
0.3000 mg | Freq: Once | INTRAMUSCULAR | Status: AC
  Administered 2020-04-20: 0.3 mg via INTRAMUSCULAR
  Filled 2020-04-20: qty 0.3

## 2020-04-20 MED ORDER — FAMOTIDINE IN NACL 20-0.9 MG/50ML-% IV SOLN
20.0000 mg | Freq: Once | INTRAVENOUS | Status: AC
  Administered 2020-04-20: 20 mg via INTRAVENOUS
  Filled 2020-04-20: qty 50

## 2020-04-20 NOTE — ED Provider Notes (Signed)
[facility] EMERGENCY DEPARTMENT Provider Note   CSN: [id number] Arrival date & time: 04/20/20  2218     History Chief Complaint  Patient presents with  . Allergic Reaction    Alison Reyes is a 30 y.o. female.  30 year old female with a history of anxiety and congenital heart defect presents to the emergency department for allergic reaction.  Reports that she ate shrimp around 11 AM this morning.  She has been self managing her symptoms with Benadryl throughout the day.  Has had mild allergy to shrimp in the past, but denies history of prior anaphylaxis.  Reports persistent swelling of her lips.  Feels as though her voice has become more hoarse and "different".  Is not experiencing any shortness of breath, vomiting, lightheadedness. No new medications and not on any antihypertensives.       Past Medical History:  Diagnosis Date  . Anxiety   . Congenital heart defect     There are no problems to display for this patient.   Past Surgical History:  Procedure Laterality Date  . arm fracture surgery       OB History   No obstetric history on file.     Family History  Problem Relation Age of Onset  . Diabetes Maternal Grandmother   . Diabetes Paternal Grandmother   . Diabetes Maternal Aunt   . Healthy Mother   . Healthy Father     Social History   Tobacco Use  . Smoking status: Former Smoker    Types: Cigarettes  . Smokeless tobacco: Never Used  Vaping Use  . Vaping Use: Never used  Substance Use Topics  . Alcohol use: Not Currently  . Drug use: Not Currently    Types: Marijuana    Comment: vicodin    Home Medications Prior to Admission medications   Medication Sig Start Date End Date Taking? Authorizing Provider  HYDROcodone-acetaminophen (NORCO/VICODIN) 5-325 MG tablet Take 1-2 tablets by mouth every 4 (four) hours as needed. 03/28/20   Rhys Martini, PA-C  methocarbamol (ROBAXIN) 500 MG tablet Take 1 tablet (500 mg total) by  mouth every 8 (eight) hours as needed for muscle spasms. 01/18/20   Milagros Loll, MD  naproxen (NAPROSYN) 500 MG tablet Take 1 tablet (500 mg total) by mouth 2 (two) times daily with a meal. 01/22/20   Wallis Bamberg, PA-C  tiZANidine (ZANAFLEX) 4 MG tablet Take 1 tablet (4 mg total) by mouth every 8 (eight) hours as needed. 01/22/20   Wallis Bamberg, PA-C  diphenhydrAMINE (BENADRYL) 25 MG tablet Take 25 mg by mouth as needed (for allergic reactions).   01/22/20  [provider]    Allergies    Benzocaine, Benzocaine-benzalkonium cl, Carbamide peroxide, Prednisone, and Nickel  Review of Systems   Review of Systems  Ten systems reviewed and are negative for acute change, except as noted in the HPI.    Physical Exam Updated Vital Signs BP 122/69   Pulse 74   Temp 98.6 F (37 C) (Oral)   Resp 18   SpO2 99%   Physical Exam Vitals and nursing note reviewed.  Constitutional:      General: She is not in acute distress.    Appearance: She is well-developed and well-nourished. She is not diaphoretic.     Comments: Nontoxic appearing, in NAD  HENT:     Head: Normocephalic and atraumatic.     Mouth/Throat:     Comments: Angioedema of the lips, most notable to the  right upper lip. Normal phonation.  Eyes:     General: No scleral icterus.    Extraocular Movements: EOM normal.     Conjunctiva/sclera: Conjunctivae normal.  Cardiovascular:     Rate and Rhythm: Normal rate and regular rhythm.     Pulses: Normal pulses.  Pulmonary:     Effort: Pulmonary effort is normal. No respiratory distress.     Breath sounds: No stridor. No wheezing, rhonchi or rales.     Comments: Lungs CTAB. Respirations even and unlabored. Musculoskeletal:        General: Normal range of motion.     Cervical back: Normal range of motion.  Skin:    General: Skin is warm and dry.     Coloration: Skin is not pale.     Findings: No erythema or rash.  Neurological:     Mental Status: She is alert and  oriented to person, place, and time.     Coordination: Coordination normal.  Psychiatric:        Mood and Affect: Mood and affect normal.        Behavior: Behavior normal.     ED Results / Procedures / Treatments   Labs (all labs ordered are listed, but only abnormal results are displayed) Labs Reviewed  I-STAT BETA HCG BLOOD, ED ([facility], [facility], [facility] ONLY)    EKG None  Radiology No results found.  Procedures .Critical Care Performed by: Antony Madura, PA-C Authorized by: Antony Madura, PA-C   Critical care provider statement:    Critical care time (minutes):  45   Critical care was necessary to treat or prevent imminent or life-threatening deterioration of the following conditions: anaphylaxis    Critical care was time spent personally by me on the following activities:  Discussions with consultants, evaluation of patient's response to treatment, examination of patient, ordering and performing treatments and interventions, ordering and review of laboratory studies, ordering and review of radiographic studies, pulse oximetry, re-evaluation of patient's condition, obtaining history from patient or surrogate and review of old charts     Medications Ordered in ED Medications  diphenhydrAMINE (BENADRYL) injection 25 mg (25 mg Intravenous Given 04/20/20 2306)  EPINEPHrine (EPI-PEN) injection 0.3 mg (0.3 mg Intramuscular Given 04/20/20 2307)  famotidine (PEPCID) IVPB 20 mg premix (0 mg Intravenous Stopped 04/21/20 0028)    ED Course  I have reviewed the triage vital signs and the nursing notes.  Pertinent labs & imaging results that were available during my care of the patient were reviewed by me and considered in my medical decision making (see chart for details).  Clinical Course as of 04/21/20 0148  Mon Apr 21, 2020  5053 Patient stable. Requested to speak with a provider as she was concerned that her lip swelling may be related to facial cellulitis.  I have assessed the patient who  continues to have some edema to her right lateral upper lip.  There is no evidence of associated heat to touch, induration, wound, purulence/drainage, fluctuance.  Have explained to the patient that I do not believe that her swelling is related to cellulitis and is, instead, related to her allergic reaction and should resolve with time.  Posterior oropharynx remains patent and nonedematous.  Patient tolerating secretions without difficulty.  Normal phonation.  No stridor. [KH]  9767 Per RN, patient wanting to "leave AMA so she can go to UC in the morning for antibiotics. She "just talked to her friend" who said it was "actually yesterday" that I ate the shrimp." [KH]  0106 Patient elected to leave AGAINST MEDICAL ADVICE. Signed AMA papers with RN prior to ED departure. Ambulated out of the department in stable condition. [KH]    Clinical Course User Index [KH] Darylene Price   MDM Rules/Calculators/A&P                          30 year old female presenting to the emergency department with findings concerning for anaphylactic reaction to shrimp.  Denies history of prior anaphylaxis.  Was treated in the ED with an EpiPen, Pepcid, Benadryl.  Steroids were not given due to reported allergy to prednisone.  Patient had clinical improvement following administration of these medications.  She was advised to remain in the ED for 4 hours for observation.  Elected to leave AGAINST MEDICAL ADVICE prior to completion of this observation.  Was seen departing the ED in stable condition.   Final Clinical Impression(s) / ED Diagnoses Final diagnoses:  Anaphylaxis, initial encounter    Rx / DC Orders ED Discharge Orders    None       Antony Madura, PA-C 04/21/20 0216    Terald Sleeper, MD 04/21/20 [phone]

## 2020-04-20 NOTE — ED Triage Notes (Signed)
Pt ate shrimp this morning despite having an allergy to it.  She took three benadryl.  This evening her lips are continuing to swell, voice is "different" and feels like her throat is swelling.  Does not paper to be in respiratory distress at this time.

## 2020-04-21 NOTE — ED Notes (Signed)
Pt had called out earlier stating she wanted an antibiotic for cellulitis around her mouth, that she has had this previously and believes this is what is happening now. Provider saw pt and discussed with her plan of care. About 15 minutes after seeing the pt, the pt called out again asking for abx, discussed with pt that the provider had already seen, evaluated and discussed the plan with her. Notified provider again pt asking for abx. Pt then called out additionally stating that she wanted "to be discharged because I am going to have to go to UC in the morning for abx". Discussed with pt that she had not yet been d/c and that she is in obs period after rec'ing epi. Pt said she did not want to stay any longer and infact she had "talked to my friend and it was actually yesterday when I at the shrimp". She wants to leave AMA, provider made aware, IV removed, risk discussed with pt and she signed out AMA>

## 2020-04-21 NOTE — ED Notes (Signed)
ED Provider at bedside. 

## 2020-04-23 ENCOUNTER — Ambulatory Visit (HOSPITAL_COMMUNITY)
Admission: EM | Admit: 2020-04-23 | Discharge: 2020-04-23 | Disposition: A | Discharge status: Home / Self Care | Payer: Self-pay | Attending: Family Medicine | Admitting: Family Medicine

## 2020-04-23 ENCOUNTER — Other Ambulatory Visit: Payer: Self-pay

## 2020-04-23 ENCOUNTER — Encounter (HOSPITAL_COMMUNITY): Payer: Self-pay

## 2020-04-23 DIAGNOSIS — M62838 Other muscle spasm: Secondary | ICD-10-CM

## 2020-04-23 DIAGNOSIS — M62830 Muscle spasm of back: Secondary | ICD-10-CM

## 2020-04-23 MED ORDER — EPINEPHRINE 0.3 MG/0.3ML IJ SOAJ: 0.3000 mg | INTRAMUSCULAR | 0 refills | Status: AC | PRN

## 2020-04-23 MED ORDER — DIAZEPAM 5 MG PO TABS: 5.0000 mg | ORAL_TABLET | Freq: Two times a day (BID) | ORAL | 0 refills | Status: DC | PRN

## 2020-04-23 NOTE — Discharge Instructions (Addendum)
Be aware, you have been prescribed a medication that may cause drowsiness. Do not combine with alcohol or illegal drugs. Please do not drive, operate heavy machinery, or take part in activities that require making important decisions while on this medication as your judgement may be clouded.
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
## 2020-07-19 ENCOUNTER — Emergency Department (HOSPITAL_COMMUNITY): Payer: Self-pay

## 2020-07-19 ENCOUNTER — Other Ambulatory Visit: Payer: Self-pay

## 2020-07-19 ENCOUNTER — Emergency Department (HOSPITAL_COMMUNITY)
Admission: EM | Admit: 2020-07-19 | Discharge: 2020-07-19 | Disposition: A | Discharge status: Home / Self Care | Payer: Self-pay | Attending: Emergency Medicine | Admitting: Emergency Medicine

## 2020-07-19 DIAGNOSIS — M549 Dorsalgia, unspecified: Secondary | ICD-10-CM | POA: Insufficient documentation

## 2020-07-19 DIAGNOSIS — R000 Tachycardia, unspecified: Secondary | ICD-10-CM | POA: Insufficient documentation

## 2020-07-19 DIAGNOSIS — M542 Cervicalgia: Secondary | ICD-10-CM | POA: Insufficient documentation

## 2020-07-19 DIAGNOSIS — Z5321 Procedure and treatment not carried out due to patient leaving prior to being seen by health care provider: Secondary | ICD-10-CM | POA: Insufficient documentation

## 2020-07-19 DIAGNOSIS — M25562 Pain in left knee: Secondary | ICD-10-CM | POA: Insufficient documentation

## 2020-07-19 DIAGNOSIS — R519 Headache, unspecified: Secondary | ICD-10-CM | POA: Insufficient documentation

## 2020-07-19 DIAGNOSIS — R079 Chest pain, unspecified: Secondary | ICD-10-CM | POA: Insufficient documentation

## 2020-07-19 DIAGNOSIS — M25561 Pain in right knee: Secondary | ICD-10-CM | POA: Insufficient documentation
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
## 2020-07-19 NOTE — ED Notes (Signed)
Patient refusing xray and CT at this time, provided ice for head
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
## 2020-09-03 ENCOUNTER — Ambulatory Visit (HOSPITAL_COMMUNITY)
Admission: EM | Admit: 2020-09-03 | Discharge: 2020-09-03 | Disposition: A | Discharge status: Home / Self Care | Payer: Self-pay

## 2020-09-03 ENCOUNTER — Other Ambulatory Visit: Payer: Self-pay

## 2020-09-03 NOTE — ED Triage Notes (Signed)
PT called times 2 with no answer

## 2020-11-15 ENCOUNTER — Emergency Department (HOSPITAL_COMMUNITY)
Admission: EM | Admit: 2020-11-15 | Discharge: 2020-11-16 | Disposition: A | Discharge status: Home / Self Care | Payer: Self-pay | Attending: Emergency Medicine | Admitting: Emergency Medicine

## 2020-11-15 ENCOUNTER — Other Ambulatory Visit: Payer: Self-pay

## 2020-11-15 ENCOUNTER — Encounter (HOSPITAL_COMMUNITY): Payer: Self-pay | Admitting: *Deleted

## 2020-11-15 ENCOUNTER — Ambulatory Visit (HOSPITAL_COMMUNITY)
Admission: EM | Admit: 2020-11-15 | Discharge: 2020-11-15 | Disposition: A | Discharge status: Home / Self Care | Payer: Self-pay | Attending: Emergency Medicine | Admitting: Emergency Medicine

## 2020-11-15 DIAGNOSIS — R809 Proteinuria, unspecified: Secondary | ICD-10-CM

## 2020-11-15 DIAGNOSIS — R220 Localized swelling, mass and lump, head: Secondary | ICD-10-CM | POA: Insufficient documentation

## 2020-11-15 DIAGNOSIS — Z87891 Personal history of nicotine dependence: Secondary | ICD-10-CM | POA: Insufficient documentation

## 2020-11-15 DIAGNOSIS — M2550 Pain in unspecified joint: Secondary | ICD-10-CM

## 2020-11-15 DIAGNOSIS — N289 Disorder of kidney and ureter, unspecified: Secondary | ICD-10-CM | POA: Insufficient documentation

## 2020-11-15 DIAGNOSIS — R21 Rash and other nonspecific skin eruption: Secondary | ICD-10-CM | POA: Insufficient documentation

## 2020-11-15 LAB — URINALYSIS, ROUTINE W REFLEX MICROSCOPIC
Bacteria, UA: NONE SEEN
Bilirubin Urine: NEGATIVE
Glucose, UA: NEGATIVE mg/dL
Hgb urine dipstick: NEGATIVE
Ketones, ur: NEGATIVE mg/dL
Leukocytes,Ua: NEGATIVE
Nitrite: NEGATIVE
Protein, ur: NEGATIVE mg/dL
Specific Gravity, Urine: 1.01 (ref 1.005–1.030)
pH: 6.5 (ref 5.0–8.0)

## 2020-11-15 LAB — COMPREHENSIVE METABOLIC PANEL
ALT: 17 U/L (ref 0–44)
AST: 31 U/L (ref 15–41)
Albumin: 4.7 g/dL (ref 3.5–5.0)
Alkaline Phosphatase: 49 U/L (ref 38–126)
Anion gap: 7 (ref 5–15)
BUN: 8 mg/dL (ref 6–20)
CO2: 30 mmol/L (ref 22–32)
Calcium: 9.7 mg/dL (ref 8.9–10.3)
Chloride: 101 mmol/L (ref 98–111)
Creatinine, Ser: 0.75 mg/dL (ref 0.44–1.00)
GFR, Estimated: >60 mL/min (ref >60)
Glucose, Bld: 99 mg/dL (ref 70–99)
Potassium: 3.7 mmol/L (ref 3.5–5.1)
Sodium: 138 mmol/L (ref 135–145)
Total Bilirubin: 0.8 mg/dL (ref 0.3–1.2)
Total Protein: 7.8 g/dL (ref 6.5–8.1)

## 2020-11-15 LAB — POCT URINALYSIS DIPSTICK, ED / UC
Glucose, UA: NEGATIVE mg/dL
Hgb urine dipstick: NEGATIVE
Ketones, ur: 15 mg/dL — AB
Leukocytes,Ua: NEGATIVE
Nitrite: NEGATIVE
Protein, ur: 30 mg/dL — AB
Specific Gravity, Urine: 1.025 (ref 1.005–1.030)
Urobilinogen, UA: 1 mg/dL (ref 0.0–1.0)
pH: 6 (ref 5.0–8.0)

## 2020-11-15 LAB — CBC WITH DIFFERENTIAL/PLATELET
Abs Immature Granulocytes: 0.02 10*3/uL (ref 0.00–0.07)
Basophils Absolute: 0.1 10*3/uL (ref 0.0–0.1)
Basophils Relative: 1 %
Eosinophils Absolute: 0.2 10*3/uL (ref 0.0–0.5)
Eosinophils Relative: 2 %
HCT: 38.5 % (ref 36.0–46.0)
Hemoglobin: 12.8 g/dL (ref 12.0–15.0)
Immature Granulocytes: 0 %
Lymphocytes Relative: 34 %
Lymphs Abs: 3.6 10*3/uL (ref 0.7–4.0)
MCH: 32.2 pg (ref 26.0–34.0)
MCHC: 33.2 g/dL (ref 30.0–36.0)
MCV: 97 fL (ref 80.0–100.0)
Monocytes Absolute: 1 10*3/uL (ref 0.1–1.0)
Monocytes Relative: 10 %
Neutro Abs: 5.8 10*3/uL (ref 1.7–7.7)
Neutrophils Relative %: 53 %
Platelets: 278 10*3/uL (ref 150–400)
RBC: 3.97 MIL/uL (ref 3.87–5.11)
RDW: 11.6 % (ref 11.5–15.5)
WBC: 10.7 10*3/uL — ABNORMAL HIGH (ref 4.0–10.5)
nRBC: 0 % (ref 0.0–0.2)

## 2020-11-15 LAB — POC URINE PREG, ED: Preg Test, Ur: NEGATIVE

## 2020-11-15 MED ORDER — DEXAMETHASONE SODIUM PHOSPHATE 10 MG/ML IJ SOLN
10.0000 mg | Freq: Once | INTRAMUSCULAR | Status: AC
  Administered 2020-11-16: 10 mg via INTRAVENOUS
  Filled 2020-11-15: qty 1

## 2020-11-15 MED ORDER — SODIUM CHLORIDE 0.9 % IV BOLUS
1000.0000 mL | Freq: Once | INTRAVENOUS | Status: AC
  Administered 2020-11-15: 1000 mL via INTRAVENOUS

## 2020-11-15 NOTE — ED Notes (Signed)
Patient is being discharged from the Urgent Care and sent to the Emergency Department via personal vehicle . Per Ether Griffins NP, patient is in need of higher level of care due to protein in urine, rash, sob, and fatigue. Patient is aware and verbalizes understanding of plan of care.  Vitals:   11/15/20 1044  BP: (!) 117/50  Pulse: 78  Resp: 14  Temp: 98.4 F (36.9 C)  SpO2: 96%
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
## 2020-11-15 NOTE — ED Triage Notes (Addendum)
Pt reports back pain, joint pain, facial rash with "burning" sensation, chin swelling, fatigue onset 3 days ago.  Denies fevers.  Pt states concerned for lupus, as 2 siblings have it. Pt falling asleep during triage, but easily rousable; states "I didn't sleep at all last night".

## 2020-11-15 NOTE — Discharge Instructions (Addendum)
Please go to the ED for further evaluation of your proteinuria as well as your other symptoms

## 2020-11-15 NOTE — ED Provider Notes (Signed)
[facility]    CSN: [id number] Arrival date & time: 11/15/20  1012      History   Chief Complaint Chief Complaint  Patient presents with   Rash   Fatigue   Joint Pain    HPI Alison Reyes is a 30 y.o. female.   Patient here for evaluation of facial rash, back pain, fatigue, and generalized joint pain that has been getting progressively worse over the past 3 days.  Reports symptoms have been intermittent and ongoing for a while.  Reports concern about lupus as she states that 2 siblings have been diagnosed with it.  Reports taking OTC medication with minimal symptom relief.  Reports applying aloe to rash which does help with the irritation and itching.  Denies any trauma, injury, or other precipitating event.  Denies any specific alleviating or aggravating factors.  Denies any fevers, chest pain, shortness of breath, N/V/D, numbness, tingling, weakness, abdominal pain, or headaches.    The history is provided by the patient.  Rash Associated symptoms: fatigue, joint pain and myalgias   Associated symptoms: no fever    Past Medical History:  Diagnosis Date   Anxiety    Congenital heart defect     There are no problems to display for this patient.   Past Surgical History:  Procedure Laterality Date   arm fracture surgery      OB History   No obstetric history on file.      Home Medications    Prior to Admission medications   Medication Sig Start Date End Date Taking? Authorizing Provider  diazepam (VALIUM) 5 MG tablet Take 1 tablet (5 mg total) by mouth every 12 (twelve) hours as needed for muscle spasms. 04/23/20   Mardella Layman, MD  EPINEPHrine 0.3 mg/0.3 mL IJ SOAJ injection Inject 0.3 mg into the muscle as needed for anaphylaxis. 04/23/20   Mardella Layman, MD  HYDROcodone-acetaminophen (NORCO/VICODIN) 5-325 MG tablet Take 1-2 tablets by mouth every 4 (four) hours as needed. 03/28/20   Rhys Martini, PA-C  diphenhydrAMINE (BENADRYL) 25 MG tablet  Take 25 mg by mouth as needed (for allergic reactions).   01/22/20  [provider]    Family History Family History  Problem Relation Age of Onset   Healthy Mother    Healthy Father    Lupus Sister    Lupus Sister    Diabetes Maternal Grandmother    Diabetes Paternal Grandmother    Diabetes Maternal Aunt     Social History Social History   Tobacco Use   Smoking status: Former    Types: Cigarettes   Smokeless tobacco: Never  Vaping Use   Vaping Use: Never used  Substance Use Topics   Alcohol use: Not Currently   Drug use: Not Currently    Types: Marijuana     Allergies   Benzocaine, Shellfish allergy, Benzocaine-benzalkonium cl, Carbamide peroxide, Prednisone, and Nickel   Review of Systems Review of Systems  Constitutional:  Positive for fatigue. Negative for fever.  Musculoskeletal:  Positive for arthralgias, back pain and myalgias.  Skin:  Positive for rash.  All other systems reviewed and are negative.   Physical Exam Triage Vital Signs ED Triage Vitals  Enc Vitals Group     BP 11/15/20 1044 (!) 117/50     Pulse Rate 11/15/20 1044 78     Resp 11/15/20 1044 14     Temp 11/15/20 1044 98.4 F (36.9 C)     Temp Source 11/15/20 1044 Oral  SpO2 11/15/20 1044 96 %     Weight --      Height --      Head Circumference --      Peak Flow --      Pain Score 11/15/20 1045 9     Pain Loc --      Pain Edu? --      Excl. in GC? --    No data found.  Updated Vital Signs BP (!) 117/50   Pulse 78   Temp 98.4 F (36.9 C) (Oral)   Resp 14   LMP 10/13/2020 (Approximate) Comment: reports being approx 5 days late  SpO2 96%   Visual Acuity Right Eye Distance:   Left Eye Distance:   Bilateral Distance:    Right Eye Near:   Left Eye Near:    Bilateral Near:     Physical Exam Vitals and nursing note reviewed.  Constitutional:      General: She is not in acute distress.    Appearance: Normal appearance. She is not ill-appearing,  toxic-appearing or diaphoretic.  HENT:     Head: Normocephalic and atraumatic. No raccoon eyes, contusion or laceration.     Nose: No congestion.     Mouth/Throat:     Pharynx: No oropharyngeal exudate.  Eyes:     Extraocular Movements: Extraocular movements intact.     Conjunctiva/sclera: Conjunctivae normal.  Cardiovascular:     Rate and Rhythm: Normal rate.     Pulses: Normal pulses.     Heart sounds: Normal heart sounds.  Pulmonary:     Effort: Pulmonary effort is normal.     Breath sounds: Normal breath sounds.  Abdominal:     General: Abdomen is flat.     Tenderness: There is right CVA tenderness and left CVA tenderness.  Musculoskeletal:        General: Normal range of motion.     Cervical back: Normal range of motion.  Skin:    General: Skin is warm and dry.     Findings: Rash present. Rash is urticarial (cheeks and eyes).  Neurological:     General: No focal deficit present.     Mental Status: She is alert and oriented to person, place, and time.  Psychiatric:        Mood and Affect: Mood normal.     UC Treatments / Results  Labs (all labs ordered are listed, but only abnormal results are displayed) Labs Reviewed  POCT URINALYSIS DIPSTICK, ED / UC - Abnormal; Notable for the following components:      Result Value   Bilirubin Urine SMALL (*)    Ketones, ur 15 (*)    Protein, ur 30 (*)    All other components within normal limits  POC URINE PREG, ED    EKG   Radiology No results found.  Procedures Procedures (including critical care time)  Medications Ordered in UC Medications - No data to display  Initial Impression / Assessment and Plan / UC Course  I have reviewed the triage vital signs and the nursing notes.  Pertinent labs & imaging results that were available during my care of the patient were reviewed by me and considered in my medical decision making (see chart for details).    Urinalysis shows bilirubin, ketones 15, and protein 30.  Due  to proteinuria and CVA tenderness, it is recommended that patient go to the ED for further evaluation at a higher level of care.  Patient is stable to go via private  vehicle.   Final Clinical Impressions(s) / UC Diagnoses   Final diagnoses:  Proteinuria, unspecified type     Discharge Instructions      Please go to the ED for further evaluation of your proteinuria, back pain, and rash.      ED Prescriptions   None    PDMP not reviewed this encounter.   Ivette Loyal, NP 11/15/20 1149

## 2020-11-15 NOTE — ED Triage Notes (Signed)
Pt came in with c/o red rash on face lasting a couple of years on and off. Pt has bilateral kidney pain and feeling of malaise lasting two days. Pt slow to speak. Pt has loss of appetite. Joint pain and SOB. Face is swollen and red. Pt was seen at [facility] today and they noted protein in her urine and told her to come here. Pt has family hx of lupus

## 2020-11-16 NOTE — Discharge Instructions (Signed)
I am concerned that you may have an autoimmune disorder.  This is concerning because of the symptoms she expressed tonight in the emergency department along with your family history.  I recommend that you be seen by your doctor and/or by a rheumatologist.  I have provided the contact information of a rheumatologist that you can contact.  You may need a referral from your primary care doctor.  Return for fever, cough, persistent vomiting, difficulty urinating.

## 2020-11-30 ENCOUNTER — Ambulatory Visit: Payer: Self-pay

## 2020-12-05 ENCOUNTER — Ambulatory Visit (HOSPITAL_COMMUNITY)
Admission: EM | Admit: 2020-12-05 | Discharge: 2020-12-05 | Disposition: A | Discharge status: Home / Self Care | Payer: Self-pay | Attending: Medical Oncology | Admitting: Medical Oncology

## 2020-12-05 ENCOUNTER — Encounter (HOSPITAL_COMMUNITY): Payer: Self-pay

## 2020-12-05 ENCOUNTER — Other Ambulatory Visit: Payer: Self-pay

## 2020-12-05 DIAGNOSIS — H109 Unspecified conjunctivitis: Secondary | ICD-10-CM

## 2020-12-05 DIAGNOSIS — H04552 Acquired stenosis of left nasolacrimal duct: Secondary | ICD-10-CM

## 2020-12-05 MED ORDER — POLYMYXIN B-TRIMETHOPRIM 10000-0.1 UNIT/ML-% OP SOLN: 1.0000 [drp] | OPHTHALMIC | 0 refills | Status: DC

## 2020-12-05 NOTE — ED Provider Notes (Signed)
[facility]    CSN: [id number] Arrival date & time: 12/05/20  1028      History   Chief Complaint Chief Complaint  Patient presents with   Eye Problem    HPI KEYLEN UZELAC is a 30 y.o. female.   HPI  Eye Pain: Pt presents with her roommate. She states that she has been having left eye swelling and pain for the past few days. Slightly worsening. Feels that she has an eye infection or stye. She denies injury, visual changes, pain of eyeball, fevers, vomiting. She does have a mild headache from symptoms. She does not wear contacts and has no history of glaucoma. She has tried various OTC medications and products for symptoms with little relief.   Past Medical History:  Diagnosis Date   Anxiety    Congenital heart defect     There are no problems to display for this patient.   Past Surgical History:  Procedure Laterality Date   arm fracture surgery      OB History   No obstetric history on file.      Home Medications    Prior to Admission medications   Medication Sig Start Date End Date Taking? Authorizing Provider  diazepam (VALIUM) 5 MG tablet Take 1 tablet (5 mg total) by mouth every 12 (twelve) hours as needed for muscle spasms. 04/23/20   Mardella Layman, MD  EPINEPHrine 0.3 mg/0.3 mL IJ SOAJ injection Inject 0.3 mg into the muscle as needed for anaphylaxis. 04/23/20   Mardella Layman, MD  HYDROcodone-acetaminophen (NORCO/VICODIN) 5-325 MG tablet Take 1-2 tablets by mouth every 4 (four) hours as needed. 03/28/20   Rhys Martini, PA-C  diphenhydrAMINE (BENADRYL) 25 MG tablet Take 25 mg by mouth as needed (for allergic reactions).   01/22/20  [provider]    Family History Family History  Problem Relation Age of Onset   Healthy Mother    Healthy Father    Lupus Sister    Lupus Sister    Diabetes Maternal Grandmother    Diabetes Paternal Grandmother    Diabetes Maternal Aunt     Social History Social History   Tobacco Use    Smoking status: Former    Types: Cigarettes   Smokeless tobacco: Never  Vaping Use   Vaping Use: Never used  Substance Use Topics   Alcohol use: Not Currently   Drug use: Not Currently    Types: Marijuana     Allergies   Benzocaine, Shellfish allergy, Benzocaine-benzalkonium cl, Carbamide peroxide, Prednisone, and Nickel   Review of Systems Review of Systems  As stated above in HPI Physical Exam Triage Vital Signs ED Triage Vitals  Enc Vitals Group     BP 12/05/20 1106 110/70     Pulse Rate 12/05/20 1106 76     Resp 12/05/20 1106 18     Temp 12/05/20 1106 98.7 F (37.1 C)     Temp Source 12/05/20 1106 Oral     SpO2 12/05/20 1106 98 %     Weight --      Height --      Head Circumference --      Peak Flow --      Pain Score 12/05/20 1105 6     Pain Loc --      Pain Edu? --      Excl. in GC? --    No data found.  Updated Vital Signs BP 110/70 (BP Location: Right Arm)   Pulse 76  Temp 98.7 F (37.1 C) (Oral)   Resp 18   LMP 11/08/2020 (Exact Date)   SpO2 98%   Visual Acuity Right Eye Distance: 20/30 Left Eye Distance: 20/25 Bilateral Distance: 20/30  Right Eye Near:   Left Eye Near:    Bilateral Near:     Physical Exam Vitals and nursing note reviewed.  Constitutional:      General: She is not in acute distress.    Appearance: Normal appearance. She is not ill-appearing, toxic-appearing or diaphoretic.  HENT:     Head: Normocephalic and atraumatic.  Eyes:     General: No scleral icterus.       Right eye: No discharge.        Left eye: No discharge.     Extraocular Movements: Extraocular movements intact.     Pupils: Pupils are equal, round, and reactive to light.     Comments: No tenderness, abnormal or painful EOMs. Mild to moderate swelling and erythema of the left upper eyelid. A tender firm lesion of underside of lateral left upper eyelid is present. Moderate erythema of conjunctiva.   Neurological:     Mental Status: She is alert.  Skin:  No vesicles    UC Treatments / Results  Labs (all labs ordered are listed, but only abnormal results are displayed) Labs Reviewed - No data to display  EKG   Radiology No results found.  Procedures Procedures (including critical care time)  Medications Ordered in UC Medications - No data to display  Initial Impression / Assessment and Plan / UC Course  I have reviewed the triage vital signs and the nursing notes.  Pertinent labs & imaging results that were available during my care of the patient were reviewed by me and considered in my medical decision making (see chart for details).     New. Vision is stable. Erythema on eyelid looks like irritation secondary to products used. Will treat for bacterial conjunctivitis with blocked tear duct. Discussed red flag signs and symptoms. Follow up with Optometry PRN.    Final Clinical Impressions(s) / UC Diagnoses   Final diagnoses:  None   Discharge Instructions   None    ED Prescriptions   None    PDMP not reviewed this encounter.   Rushie Chestnut, [place] 12/05/20 1207

## 2020-12-05 NOTE — ED Triage Notes (Signed)
Pt presents with L eye swelling and states she had a cut on her nose that had puss coming out of it. States she touched it and then her eye started swelling. Pt states the bottom of her eye feels numb and states there was drainage. States she has tried eye drops and warm compresses. Pt states she sometimes feels a stinging feeling.

## 2021-02-02 ENCOUNTER — Telehealth: Payer: Self-pay

## 2021-02-02 NOTE — Telephone Encounter (Signed)
The mobile unit is back running Cari will not be at [facility] she will at [facility] on the Mobile from 8am-5:30pm. Called patient, no answer and vm not set up. If patient returns call please reschedule with any provider for a new patient appointment or send patient to the mobile unit to still see Cari.

## 2021-02-03 ENCOUNTER — Ambulatory Visit: Payer: Self-pay | Admitting: Physician Assistant

## 2021-09-05 ENCOUNTER — Emergency Department (HOSPITAL_COMMUNITY)
Admission: EM | Admit: 2021-09-05 | Discharge: 2021-09-05 | Disposition: A | Discharge status: Home / Self Care | Payer: Self-pay | Attending: Emergency Medicine | Admitting: Emergency Medicine

## 2021-09-05 ENCOUNTER — Encounter (HOSPITAL_COMMUNITY): Payer: Self-pay

## 2021-09-05 DIAGNOSIS — L03213 Periorbital cellulitis: Secondary | ICD-10-CM | POA: Insufficient documentation

## 2021-09-05 DIAGNOSIS — H1033 Unspecified acute conjunctivitis, bilateral: Secondary | ICD-10-CM | POA: Insufficient documentation

## 2021-09-05 DIAGNOSIS — R220 Localized swelling, mass and lump, head: Secondary | ICD-10-CM | POA: Insufficient documentation

## 2021-09-05 MED ORDER — AMOXICILLIN-POT CLAVULANATE 875-125 MG PO TABS: 1.0000 | ORAL_TABLET | Freq: Two times a day (BID) | ORAL | 0 refills | Status: DC

## 2021-09-05 MED ORDER — AMOXICILLIN-POT CLAVULANATE 875-125 MG PO TABS
1.0000 | ORAL_TABLET | Freq: Once | ORAL | Status: AC
  Administered 2021-09-05: 1 via ORAL
  Filled 2021-09-05: qty 1

## 2021-09-05 MED ORDER — TOBRAMYCIN 0.3 % OP SOLN
1.0000 [drp] | Freq: Once | OPHTHALMIC | Status: AC
  Administered 2021-09-05: 1 [drp] via OPHTHALMIC
  Filled 2021-09-05 (×2): qty 5

## 2021-09-05 NOTE — ED Provider Notes (Signed)
[facility]  HOSPITAL-EMERGENCY DEPT Provider Note   CSN: [id number] Arrival date & time: 09/05/21  2156     History  Chief Complaint  Patient presents with   Facial Swelling    Alison Reyes is a 31 y.o. female.  Patient is a 31 year old female with a history of anxiety, congenital heart defect and recent diagnosis of lupus who presents today with issues with her eyes.  She reports symptoms started approximately a week ago from the left eye where she was having redness of the conjunctive a and matting and drainage from the eye with ongoing itching.  She had some leftover Polytrim drops that she had been using and reports that initially started getting a little bit better but has been persistent and is now starting to move over into her right eye with similar symptoms.  However since last night she started having swelling of her left upper and lower eyelid and some pain deep in her eye.  She reports her vision seems blurry but that is because her eyelid is in the way.  When her eyelid is moved her vision is still at baseline.  She has some minimal soreness with moving her eye.  She denies any fever or headache.  She denies any nausea or vomiting.  She does not wear contacts and denies getting anything in her eye.  In addition to the Polytrim drops she is also using castor oil.  Denies any recent trauma around her eyes but she does have her left eyebrow pierced and intermittently put an earring through it to keep the hole open.  The history is provided by the patient.       Home Medications Prior to Admission medications   Medication Sig Start Date End Date Taking? Authorizing Provider  amoxicillin-clavulanate (AUGMENTIN) 875-125 MG tablet Take 1 tablet by mouth every 12 (twelve) hours. 09/05/21  Yes Gwyneth Sprout, MD  diazepam (VALIUM) 5 MG tablet Take 1 tablet (5 mg total) by mouth every 12 (twelve) hours as needed for muscle spasms. 04/23/20   Mardella Layman, MD   EPINEPHrine 0.3 mg/0.3 mL IJ SOAJ injection Inject 0.3 mg into the muscle as needed for anaphylaxis. 04/23/20   Mardella Layman, MD  HYDROcodone-acetaminophen (NORCO/VICODIN) 5-325 MG tablet Take 1-2 tablets by mouth every 4 (four) hours as needed. 03/28/20   Rhys Martini, PA-C  trimethoprim-polymyxin b (POLYTRIM) ophthalmic solution Place 1 drop into the left eye every 4 (four) hours. 12/05/20   Rushie Chestnut, PA-C  diphenhydrAMINE (BENADRYL) 25 MG tablet Take 25 mg by mouth as needed (for allergic reactions).   01/22/20  [provider]      Allergies    Benzocaine, Shellfish allergy, Benzocaine-benzalkonium cl, Carbamide peroxide, Prednisone, and Nickel    Review of Systems   Review of Systems  Physical Exam Updated Vital Signs BP 120/61 (BP Location: Right Arm)   Pulse 72   Temp 98.2 F (36.8 C) (Oral)   Resp 18   Ht 5\' 6"  (1.676 m)   Wt 61.2 kg   SpO2 96%   BMI 21.79 kg/m  Physical Exam Vitals and nursing note reviewed.  Constitutional:      General: She is not in acute distress.    Appearance: Normal appearance.  HENT:     Head: Normocephalic.     Nose: Nose normal.  Eyes:     General: Vision grossly intact.        Right eye: Discharge present.  Left eye: Discharge present.    Extraocular Movements: Extraocular movements intact.     Conjunctiva/sclera:     Right eye: Right conjunctiva is injected. Exudate present.     Left eye: Left conjunctiva is injected. Exudate present.     Pupils: Pupils are equal, round, and reactive to light.     Comments: Left upper and lower lid swelling without induration or fluctuance.  Full EOM.  No notable sty on the upper or lower lid  Cardiovascular:     Rate and Rhythm: Normal rate.  Pulmonary:     Effort: Pulmonary effort is normal.  Musculoskeletal:     Cervical back: Normal range of motion and neck supple.  Skin:    General: Skin is warm.  Neurological:     Mental Status: She is alert. Mental status is at  baseline.     ED Results / Procedures / Treatments   Labs (all labs ordered are listed, but only abnormal results are displayed) Labs Reviewed - No data to display  EKG None  Radiology No results found.  Procedures Procedures    Medications Ordered in ED Medications  tobramycin (TOBREX) 0.3 % ophthalmic solution 1 drop (has no administration in time range)  amoxicillin-clavulanate (AUGMENTIN) 875-125 MG per tablet 1 tablet (has no administration in time range)    ED Course/ Medical Decision Making/ A&P                           Medical Decision Making Risk Prescription drug management.   Patient is a 31 year old female presenting today with symptoms of conjunctivitis which started on the left but is now moved to the right but in addition to that significant swelling of her left eyelid.  Concern for early preseptal cellulitis.  No evidence of stye or abscess at this time.  She does have a piercing on her left eyebrow which she intermittently puts her ring through but does not appear to have significant infection of that area.  She has full range of motion of her eye with minimal discomfort and low suspicion for orbital cellulitis at this time.  Her vision is intact when her swollen lid is moved out of her visual field.  Feel that patient needs treatment for preseptal cellulitis.  She did have some leftover Polytrim drops that she was using but they appear to be expired.  She does not wear contacts and low suspicion for corneal ulcers.  She was recently diagnosed with lupus but feel this is most likely infectious and not related to her lupus at this time.  She was also putting castor oil in her eyes and recommended that she stop doing this immediately.  She was given Tobrex eyedrops and encouraged to discontinue the Polytrim.  She was also started on Augmentin for possible early preseptal cellulitis.  She was given strict return precautions and follow-up with ophthalmology if her  symptoms were not improving.        Final Clinical Impression(s) / ED Diagnoses Final diagnoses:  Acute conjunctivitis of both eyes, unspecified acute conjunctivitis type  Preseptal cellulitis of left eye    Rx / DC Orders ED Discharge Orders          Ordered    amoxicillin-clavulanate (AUGMENTIN) 875-125 MG tablet  Every 12 hours        09/05/21 2243              Gwyneth Sprout, MD 09/05/21 2248

## 2021-09-05 NOTE — Discharge Instructions (Addendum)
Return to the emergency room if you start running a fever you get severe pain in your eye or if the swelling worsens.  Also if you start having loss of vision or feel like your eye is blurry and you need glasses return to the ER.  You could start your antibiotic tomorrow.  As for the drop do 1 to 2 drops in both eyes every 4 hours for the next 5 to 7 days based on your symptoms.

## 2021-09-05 NOTE — ED Triage Notes (Signed)
Ambulatory to ED with c/o L eye swelling. States over the week, her eye has been red and having some drainage and then says that last night it started getting swollen. States she can still see out of her eye but it's blurry. Also c/o sore throat.

## 2022-12-22 ENCOUNTER — Other Ambulatory Visit: Payer: Self-pay

## 2022-12-22 ENCOUNTER — Emergency Department (HOSPITAL_BASED_OUTPATIENT_CLINIC_OR_DEPARTMENT_OTHER): Payer: Self-pay | Admitting: Radiology

## 2022-12-22 ENCOUNTER — Emergency Department (HOSPITAL_BASED_OUTPATIENT_CLINIC_OR_DEPARTMENT_OTHER)
Admission: EM | Admit: 2022-12-22 | Discharge: 2022-12-23 | Disposition: A | Discharge status: Home / Self Care | Payer: Self-pay | Attending: Emergency Medicine | Admitting: Emergency Medicine

## 2022-12-22 ENCOUNTER — Encounter (HOSPITAL_BASED_OUTPATIENT_CLINIC_OR_DEPARTMENT_OTHER): Payer: Self-pay | Admitting: *Deleted

## 2022-12-22 DIAGNOSIS — R110 Nausea: Secondary | ICD-10-CM | POA: Insufficient documentation

## 2022-12-22 DIAGNOSIS — M79671 Pain in right foot: Secondary | ICD-10-CM

## 2022-12-22 DIAGNOSIS — R0981 Nasal congestion: Secondary | ICD-10-CM | POA: Insufficient documentation

## 2022-12-22 DIAGNOSIS — R0602 Shortness of breath: Secondary | ICD-10-CM | POA: Insufficient documentation

## 2022-12-22 DIAGNOSIS — R202 Paresthesia of skin: Secondary | ICD-10-CM | POA: Insufficient documentation

## 2022-12-22 DIAGNOSIS — R002 Palpitations: Secondary | ICD-10-CM | POA: Insufficient documentation

## 2022-12-22 DIAGNOSIS — M7989 Other specified soft tissue disorders: Secondary | ICD-10-CM | POA: Insufficient documentation

## 2022-12-22 DIAGNOSIS — M79672 Pain in left foot: Secondary | ICD-10-CM | POA: Insufficient documentation

## 2022-12-22 LAB — BASIC METABOLIC PANEL
Anion gap: 9 (ref 5–15)
BUN: 12 mg/dL (ref 6–20)
CO2: 32 mmol/L (ref 22–32)
Calcium: 10.4 mg/dL — ABNORMAL HIGH (ref 8.9–10.3)
Chloride: 96 mmol/L — ABNORMAL LOW (ref 98–111)
Creatinine, Ser: 0.89 mg/dL (ref 0.44–1.00)
GFR, Estimated: >60 mL/min (ref >60)
Glucose, Bld: 121 mg/dL — ABNORMAL HIGH (ref 70–99)
Potassium: 3.7 mmol/L (ref 3.5–5.1)
Sodium: 137 mmol/L (ref 135–145)

## 2022-12-22 LAB — CBC
HCT: 42.8 % (ref 36.0–46.0)
Hemoglobin: 14.4 g/dL (ref 12.0–15.0)
MCH: 30.7 pg (ref 26.0–34.0)
MCHC: 33.6 g/dL (ref 30.0–36.0)
MCV: 91.3 fL (ref 80.0–100.0)
Platelets: 411 10*3/uL — ABNORMAL HIGH (ref 150–400)
RBC: 4.69 MIL/uL (ref 3.87–5.11)
RDW: 11.6 % (ref 11.5–15.5)
WBC: 8.1 10*3/uL (ref 4.0–10.5)
nRBC: 0 % (ref 0.0–0.2)

## 2022-12-22 LAB — TROPONIN I (HIGH SENSITIVITY)
Troponin I (High Sensitivity): <2 ng/L (ref <18)
Troponin I (High Sensitivity): <2 ng/L (ref <18)

## 2022-12-22 NOTE — ED Triage Notes (Signed)
Pt is here for chest pain which began this pm.  Pain is in left chest and she had palpitations with this.  Pt also notes pain and swelling to right foot.  Not associated with any trauma.

## 2022-12-23 MED ORDER — MELOXICAM 7.5 MG PO TABS: 7.5000 mg | ORAL_TABLET | Freq: Every day | ORAL | 0 refills | Status: DC

## 2022-12-23 MED ORDER — DEXAMETHASONE SODIUM PHOSPHATE 10 MG/ML IJ SOLN
8.0000 mg | Freq: Once | INTRAMUSCULAR | Status: AC
Start: 2022-12-23 — End: 2022-12-23
  Administered 2022-12-23: 8 mg via INTRAVENOUS
  Filled 2022-12-23: qty 1

## 2022-12-23 MED ORDER — IBUPROFEN 400 MG PO TABS: 400.0000 mg | ORAL_TABLET | Freq: Once | ORAL | Status: DC

## 2022-12-23 MED ORDER — ONDANSETRON 8 MG PO TBDP
8.0000 mg | ORAL_TABLET | Freq: Three times a day (TID) | ORAL | 0 refills | Status: AC | PRN
Start: 2022-12-23 — End: ?

## 2022-12-23 NOTE — ED Provider Notes (Signed)
[place] EMERGENCY DEPARTMENT AT [facility] Provider Note   CSN: [id number] Arrival date & time: 12/22/22  2124     History  Chief Complaint  Patient presents with   Chest Pain    Alison Reyes is a 32 y.o. female.  The history is provided by the patient and a significant other.  Patient with history of anxiety presents for multiple complaints  #1 patient reports over the past days she has had chest pain and feeling that her heart is fluttering.  She reports shortness of breath but no syncope.  She reports that seem to be worse at work.  She is now feeling improved and chest pain-free  #2 she reports right foot pain and swelling of unclear etiology.  She reports it feels swollen and numb and cold.  No trauma.  It hurts to walk.  #3 patient also reports nasal congestion and bumps in her  Nose  #4 she reports nausea and reports her kidneys are swollen.  #5 patient reports she may have lupus as she has had joint issues previously but is never been able to get follow-up  Patient reports she has had previous history of palpitations but only wore a cardiac monitor for 24 hours and would like to have this done again Patient is requesting a steroid injection for her pain  No known history of CAD/VTE.  Patient does not smoke cigarettes Past Medical History:  Diagnosis Date   Anxiety    Congenital heart defect     Home Medications Prior to Admission medications   Medication Sig Start Date End Date Taking? Authorizing Provider  meloxicam (MOBIC) 7.5 MG tablet Take 1 tablet (7.5 mg total) by mouth daily. 12/23/22  Yes Zadie Rhine, MD  ondansetron (ZOFRAN-ODT) 8 MG disintegrating tablet Take 1 tablet (8 mg total) by mouth every 8 (eight) hours as needed. 12/23/22  Yes Zadie Rhine, MD  EPINEPHrine 0.3 mg/0.3 mL IJ SOAJ injection Inject 0.3 mg into the muscle as needed for anaphylaxis. 04/23/20   Mardella Layman, MD  diphenhydrAMINE (BENADRYL) 25 MG tablet  Take 25 mg by mouth as needed (for allergic reactions).   01/22/20  [provider]      Allergies    Benzocaine, Shellfish allergy, Benzocaine-benzalkonium cl, Carbamide peroxide, Prednisone, and Nickel    Review of Systems   Review of Systems  Respiratory:  Positive for shortness of breath.   Cardiovascular:  Positive for chest pain and palpitations.  Neurological:  Negative for syncope.    Physical Exam Updated Vital Signs BP 128/75   Pulse 81   Temp 99 F (37.2 C) (Oral)   Resp 13   SpO2 99%  Physical Exam CONSTITUTIONAL: Well developed/well nourished HEAD: Normocephalic/atraumatic EYES: EOMI/PERRL ENMT: Mucous membranes moist NECK: supple no meningeal signs SPINE/BACK:entire spine nontender CV: S1/S2 noted, no murmurs/rubs/gallops noted LUNGS: Lungs are clear to auscultation bilaterally, no apparent distress ABDOMEN: soft, nontender, no rebound or guarding, bowel sounds noted throughout abdomen GU:no cva tenderness NEURO: Pt is awake/alert/appropriate, moves all extremitiesx4.  No facial droop.   EXTREMITIES: pulses normal/equal, full ROM Mild tenderness noted to the dorsal aspect of the right foot.  Distal pulses equal and intact.  The feet are warm to touch.  There is no discoloration, no erythema, no bruising or swelling.  There are no puncture wounds to either foot No wounds in the webspaces of right foot There is no calf tenderness or edema SKIN: warm, color normal PSYCH: no abnormalities of mood noted, alert  and oriented to situation  ED Results / Procedures / Treatments   Labs (all labs ordered are listed, but only abnormal results are displayed) Labs Reviewed  BASIC METABOLIC PANEL - Abnormal; Notable for the following components:      Result Value   Chloride 96 (*)    Glucose, Bld 121 (*)    Calcium 10.4 (*)    All other components within normal limits  CBC - Abnormal; Notable for the following components:   Platelets 411 (*)    All other  components within normal limits  TROPONIN I (HIGH SENSITIVITY)  TROPONIN I (HIGH SENSITIVITY)    EKG EKG Interpretation Date/Time:  Wednesday December 22 2022 21:35:39 EDT Ventricular Rate:  100 PR Interval:  148 QRS Duration:  68 QT Interval:  334 QTC Calculation: 430 R Axis:   36  Text Interpretation: Normal sinus rhythm Possible Anterior infarct , age undetermined Abnormal ECG Confirmed by Zadie Rhine ([id number]) on 12/23/2022 12:24:22 AM  Radiology DG Foot Complete Right  Result Date: 12/22/2022 CLINICAL DATA:  Right foot pain and swelling. EXAM: RIGHT FOOT COMPLETE - 3+ VIEW COMPARISON:  None Available. FINDINGS: There is no evidence of fracture or dislocation. There is no evidence of arthropathy or other focal bone abnormality. No erosions. No focal soft tissue abnormality. IMPRESSION: Negative radiographs of the right foot. Electronically Signed   By: Narda Rutherford M.D.   On: 12/22/2022 22:54   DG Chest 2 View  Result Date: 12/22/2022 CLINICAL DATA:  Chest pain. EXAM: CHEST - 2 VIEW COMPARISON:  None Available. FINDINGS: The cardiomediastinal contours are normal. The lungs are clear. Pulmonary vasculature is normal. No consolidation, pleural effusion, or pneumothorax. No acute osseous abnormalities are seen. IMPRESSION: No active cardiopulmonary disease. Electronically Signed   By: Narda Rutherford M.D.   On: 12/22/2022 22:53    Procedures Procedures    Medications Ordered in ED Medications  dexamethasone (DECADRON) injection 8 mg (8 mg Intravenous Given 12/23/22 0050)    ED Course/ Medical Decision Making/ A&P             HEART Score: 1                    Medical Decision Making Amount and/or Complexity of Data Reviewed Labs: ordered. Radiology: ordered.  Risk Prescription drug management.   This patient presents to the ED for concern of chest pain and palpitations, this involves an extensive number of treatment options, and is a complaint that carries  with it a high risk of complications and morbidity.  The differential diagnosis includes but is not limited to acute coronary syndrome, aortic dissection, pulmonary embolism, pericarditis, pneumothorax, pneumonia, myocarditis, pleurisy, esophageal rupture, SVT, atrial fibrillation, ventricular tachycardia    Comorbidities that complicate the patient evaluation: Patient's presentation is complicated by their history of anxiety  Social Determinants of Health: Patient's impaired access to primary care  increases the complexity of managing their presentation  Additional history obtained: Additional history obtained from significant other   Lab Tests: I Ordered, and personally interpreted labs.  The pertinent results include: Labs overall unremarkable  Imaging Studies ordered: I ordered imaging studies including X-ray chest and right foot   I independently visualized and interpreted imaging which showed no acute findings I agree with the radiologist interpretation   Medicines ordered and prescription drug management: I ordered medication including Decadron for joint pain  Test Considered: Patient is low risk / negative by heart score, therefore do not feel that cardiac workup  is indicated.  Low suspicion for PE given history, exam, no hypoxia, defer further workup   Reevaluation: After the interventions noted above, I reevaluated the patient and found that they have :improved  Complexity of problems addressed: Patient's presentation is most consistent with  acute presentation with potential threat to life or bodily function  Disposition: After consideration of the diagnostic results and the patient's response to treatment,  I feel that the patent would benefit from discharge   .   Patient presents for multiple complaints.  She would benefit from a PCP, given information to establish.  For chest pain and palpitations, these have resolved.  However she is interested in wearing a  monitor, will refer to cardiology  For her foot pain, she reports she has had this previously in other joints and is responded to steroids.  She requests one-time dose of steroids and she has tolerated previously (she has not tolerated prednisone) Will also place on a course of anti-inflammatories       Final Clinical Impression(s) / ED Diagnoses Final diagnoses:  Palpitations  Right foot pain    Rx / DC Orders ED Discharge Orders          Ordered    Ambulatory referral to Cardiology        12/23/22 0039    meloxicam (MOBIC) 7.5 MG tablet  Daily        12/23/22 0040    ondansetron (ZOFRAN-ODT) 8 MG disintegrating tablet  Every 8 hours PRN        12/23/22 0042              Zadie Rhine, MD 12/23/22 [phone]

## 2022-12-31 ENCOUNTER — Telehealth: Payer: Self-pay | Admitting: Physician Assistant

## 2022-12-31 NOTE — Progress Notes (Signed)
The patient no-showed for appointment despite this provider sending direct link with no response and waiting for at least 10 minutes from appointment time for patient to join. They will be marked as a NS for this appointment/time.   Patient was sent a message prior to advise for in person evaluation for Syncope. No showed video visit. Will mark no charge.  Margaretann Loveless, PA-C

## 2023-01-05 ENCOUNTER — Encounter (HOSPITAL_BASED_OUTPATIENT_CLINIC_OR_DEPARTMENT_OTHER): Payer: Self-pay

## 2023-01-10 ENCOUNTER — Ambulatory Visit: Payer: No Typology Code available for payment source | Admitting: Internal Medicine

## 2023-01-10 NOTE — Progress Notes (Deleted)
  Cardiology Office Note:  .   Date:  01/10/2023  ID:  [name], DOB 04-16-1990, MRN [id number] PCP: Patient, No Pcp Per  [facility] Providers Cardiologist:  Maisie Fus, MD { Click to update primary MD,subspecialty MD or APP then REFRESH:1}   History of Present Illness: .   Alison Reyes is a 32 y.o. female with hx of lupus, presented to the emergency room with a multitude of issues also has history of anxiety.  A referral was sent to cardiology for further workup of palpitations.  That she had heart fluttering in her chest but denied syncope.  She also noted some right foot pain as well as nasal congestion etc. See ED note.  Her ECG showed normal sinus rhythm with poor R wave progression.  There is documentation of a history of congenital heart disease however there is no elaboration.  She has been seen by [facility] cardiology.  She was worked up for syncope which was thought to be vasovagal.  She had an echocardiogram in 2018 that showed normal LV function.  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   ROS:  per HPI otherwise negative   Studies Reviewed: .         Risk Assessment/Calculations:        Physical Exam:   VS:   ***  Wt Readings from Last 3 Encounters:  09/05/21 135 lb (61.2 kg)  11/15/20 135 lb (61.2 kg)  07/19/20 135 lb (61.2 kg)    GEN: Well nourished, well developed in no acute distress NECK: No JVD; No carotid bruits CARDIAC: ***RRR, no murmurs, rubs, gallops RESPIRATORY:  Clear to auscultation without rales, wheezing or rhonchi  ABDOMEN: Soft, non-tender, non-distended EXTREMITIES:  No edema; No deformity   ASSESSMENT AND PLAN: .   Palpitations -She has no history of arrhythmia.  Will assess with a 7-day Zio patch.  Otherwise she has no signs of cardiac disease.    {Are you ordering a CV Procedure (e.g.  stress test, cath, DCCV, TEE, etc)?   Press F2        :[id number]}  Dispo: ***  Signed, Maisie Fus, MD

## 2023-01-11 ENCOUNTER — Emergency Department (HOSPITAL_COMMUNITY): Payer: Self-pay

## 2023-01-11 ENCOUNTER — Encounter (HOSPITAL_COMMUNITY): Payer: Self-pay

## 2023-01-11 ENCOUNTER — Other Ambulatory Visit: Payer: Self-pay

## 2023-01-11 ENCOUNTER — Emergency Department (HOSPITAL_COMMUNITY)
Admission: EM | Admit: 2023-01-11 | Discharge: 2023-01-11 | Disposition: A | Discharge status: Home / Self Care | Payer: Self-pay | Attending: Emergency Medicine | Admitting: Emergency Medicine

## 2023-01-11 DIAGNOSIS — Q680 Congenital deformity of sternocleidomastoid muscle: Secondary | ICD-10-CM | POA: Insufficient documentation

## 2023-01-11 DIAGNOSIS — M7912 Myalgia of auxiliary muscles, head and neck: Secondary | ICD-10-CM

## 2023-01-11 DIAGNOSIS — R079 Chest pain, unspecified: Secondary | ICD-10-CM

## 2023-01-11 DIAGNOSIS — Z5329 Procedure and treatment not carried out because of patient's decision for other reasons: Secondary | ICD-10-CM

## 2023-01-11 DIAGNOSIS — J029 Acute pharyngitis, unspecified: Secondary | ICD-10-CM | POA: Insufficient documentation

## 2023-01-11 LAB — GROUP A STREP BY PCR: Group A Strep by PCR: NOT DETECTED

## 2023-01-11 MED ORDER — CYCLOBENZAPRINE HCL 10 MG PO TABS
10.0000 mg | ORAL_TABLET | Freq: Once | ORAL | Status: AC
  Administered 2023-01-11: 10 mg via ORAL
  Filled 2023-01-11: qty 1

## 2023-01-11 MED ORDER — CYCLOBENZAPRINE HCL 10 MG PO TABS: 10.0000 mg | ORAL_TABLET | Freq: Two times a day (BID) | ORAL | 0 refills | Status: DC | PRN

## 2023-01-11 MED ORDER — CHLORHEXIDINE GLUCONATE 0.12 % MT SOLN: 15.0000 mL | Freq: Two times a day (BID) | OROMUCOSAL | 0 refills | Status: DC

## 2023-01-11 MED ORDER — KETOROLAC TROMETHAMINE 10 MG PO TABS: 10.0000 mg | ORAL_TABLET | Freq: Four times a day (QID) | ORAL | 0 refills | Status: DC | PRN

## 2023-01-11 MED ORDER — KETOROLAC TROMETHAMINE 30 MG/ML IJ SOLN
30.0000 mg | Freq: Once | INTRAMUSCULAR | Status: AC
  Administered 2023-01-11: 30 mg via INTRAMUSCULAR
  Filled 2023-01-11: qty 1

## 2023-01-11 NOTE — ED Provider Notes (Signed)
[place] EMERGENCY DEPARTMENT AT [facility] Provider Note   CSN: [id number] Arrival date & time: 01/11/23  0456     History  Chief Complaint  Patient presents with   Sore Throat   Neck Pain    Alison Reyes is a 32 y.o. female.   Sore Throat  Neck Pain   32 year old female presents emergency department with complaints of left-sided neck pain.  Patient states that she woke up in the [place] with left-sided neck pain.  Reports pain worsened with movement.  States that it feels swollen.  Denies any difficulty breathing/swallowing.  Has taken no medication for her symptoms.  Past medical history significant for congenital heart defect, anxiety  Home Medications Prior to Admission medications   Medication Sig Start Date End Date Taking? Authorizing Provider  cyclobenzaprine (FLEXERIL) 10 MG tablet Take 1 tablet (10 mg total) by mouth 2 (two) times daily as needed for muscle spasms. 01/11/23  Yes Sherian Maroon A, PA  ketorolac (TORADOL) 10 MG tablet Take 1 tablet (10 mg total) by mouth every 6 (six) hours as needed. 01/11/23  Yes Sherian Maroon A, PA  EPINEPHrine 0.3 mg/0.3 mL IJ SOAJ injection Inject 0.3 mg into the muscle as needed for anaphylaxis. 04/23/20   Mardella Layman, MD  ondansetron (ZOFRAN-ODT) 8 MG disintegrating tablet Take 1 tablet (8 mg total) by mouth every 8 (eight) hours as needed. 12/23/22   Zadie Rhine, MD  diphenhydrAMINE (BENADRYL) 25 MG tablet Take 25 mg by mouth as needed (for allergic reactions).   01/22/20  [provider]      Allergies    Benzocaine, Shellfish allergy, Benzocaine-benzalkonium cl, Carbamide peroxide, Prednisone, and Nickel    Review of Systems   Review of Systems  Musculoskeletal:  Positive for neck pain.  All other systems reviewed and are negative.   Physical Exam Updated Vital Signs BP (!) 166/98 (BP Location: Right Arm)   Pulse 87   Temp 97.8 F (36.6 C) (Oral)   Resp 18   Ht 5\' 6"   (1.676 m)   Wt 61.2 kg   SpO2 100%   BMI 21.78 kg/m  Physical Exam Vitals and nursing note reviewed.  Constitutional:      General: She is not in acute distress.    Appearance: She is well-developed.  HENT:     Head: Normocephalic and atraumatic.     Mouth/Throat:     Comments: Mild posterior pharyngeal erythema.  Uvula midline rise symmetric with phonation.  No sublingual or extremity pain or swelling.  No trismus.  Tonsils not observable. Eyes:     Conjunctiva/sclera: Conjunctivae normal.  Cardiovascular:     Rate and Rhythm: Normal rate and regular rhythm.     Heart sounds: Murmur heard.  Pulmonary:     Effort: Pulmonary effort is normal. No respiratory distress.     Breath sounds: Normal breath sounds.  Abdominal:     Palpations: Abdomen is soft.     Tenderness: There is no abdominal tenderness.  Musculoskeletal:        General: No swelling.     Cervical back: Neck supple.     Comments: Patient with tenderness and obvious spasm along left-sided SCM.  Pain elicited with horizontal rotation of the neck bilaterally.  Skin:    General: Skin is warm and dry.     Capillary Refill: Capillary refill takes less than 2 seconds.  Neurological:     Mental Status: She is alert.  Psychiatric:  Mood and Affect: Mood normal.     ED Results / Procedures / Treatments   Labs (all labs ordered are listed, but only abnormal results are displayed) Labs Reviewed  GROUP A STREP BY PCR    EKG None  Radiology No results found.  Procedures Procedures    Medications Ordered in ED Medications  ketorolac (TORADOL) 30 MG/ML injection 30 mg (30 mg Intramuscular Given 01/11/23 0625)  cyclobenzaprine (FLEXERIL) tablet 10 mg (10 mg Oral Given 01/11/23 3295)    ED Course/ Medical Decision Making/ A&P                                 Medical Decision Making Risk Prescription drug management.   This patient presents to the ED for concern of neck pain, this involves an  extensive number of treatment options, and is a complaint that carries with it a high risk of complications and morbidity.  The differential diagnosis includes torticollis, carotid artery/vertebral artery dissection, retropharyngeal abscess, Ludwig angina, other   Co morbidities that complicate the patient evaluation  See HPI   Additional history obtained:  Additional history obtained from EMR External records from outside source obtained and reviewed including hospital records   Lab Tests:  I Ordered, and personally interpreted labs.  The pertinent results include: Group A strep negative   Imaging Studies ordered:  na   Cardiac Monitoring: / EKG:  The patient was maintained on a cardiac monitor.  I personally viewed and interpreted the cardiac monitored which showed an underlying rhythm of: Sinus rhythm   Consultations Obtained:  N/a   Problem List / ED Course / Critical interventions / Medication management  SCM spasm I ordered medication including Toradol, Flexeril  Reevaluation of the patient after these medicines showed that the patient improved I have reviewed the patients home medicines and have made adjustments as needed   Social Determinants of Health:  Denies tobacco, illicit drug use   Test / Admission - Considered:  SCM spasm Vitals signs significant for blood pressure 166/98. Otherwise within normal range and stable throughout visit. Laboratory studies significant for: See above 32 year old female presents emergency department with complaints of left-sided neck pain that began to hurt her left hand overnight.  On exam, patient with appreciable tenderness of left-sided SCM with appreciable spasming.  Pain worsened with movement.  Suspect patient's symptoms are likely secondary to muscular spasm/strain.  Will treat with NSAIDs, muscle laxer as needed and follow-up with primary care in the outpatient setting.  Treatment plan discussed length with patient  and she acknowledged understanding was agreeable to said plan.  Patient overall well-appearing, afebrile in no acute distress. Worrisome signs and symptoms were discussed with the patient, and the patient acknowledged understanding to return to the ED if noticed. Patient was stable upon discharge.          Final Clinical Impression(s) / ED Diagnoses Final diagnoses:  Sternocleidomastoid muscle tenderness    Rx / DC Orders ED Discharge Orders          Ordered    ketorolac (TORADOL) 10 MG tablet  Every 6 hours PRN        01/11/23 0559    cyclobenzaprine (FLEXERIL) 10 MG tablet  2 times daily PRN        01/11/23 0559              Peter Garter, PA 01/11/23 0636    Gloris Manchester,  MD 01/11/23 9562

## 2023-01-11 NOTE — ED Triage Notes (Addendum)
Pt reports with a sore throat, pt also has swelling to the left side of her neck.

## 2023-01-11 NOTE — ED Notes (Signed)
Pt was c/o chest pain that had been "sporadic" x a few years. Pts friend stated she had been to the dr with her since 2019. Pt stated she only wanted an EKG. It was explained to her that if she was c/o CP, then bloodwork, and EKG and a CXR were standard to r/o things like pneumonia, bronchitis, masses, or a collapsed lung. She adamantly refused anything except the EKG. It was explained in detail to her the risks of refusing these things and that doing so would be against medical advice. She was a/o x4, able to answer all questions appropriately and understood the risks associated with refusing treatment AMA. Pts friend was there to witness this interaction as well.

## 2023-01-11 NOTE — ED Provider Notes (Signed)
Patient was discharged by previous shift with SCM spasm.  Was alerted by paramedic Lauren that the patient was refusing to leave.  Patient reports that she was having chest pain and wanted it documented in her chart.  I did not see any mention of chest pain in the triage note or in the previous provider's note.  She was diagnosed with a left sternocleidomastoid spasm.  She reports that she does suffer with anxiety and has chest pain with her anxiety however once it documented that she was experiencing chest pain.  I discussed with her that if she is having chest pain this requires additional workup with chest x-ray, labs, and EKG.  She reports that she did not want the workup she just wanted the diagnosis.  Per the patient's girlfriend/partner, she has been experiencing chest pain off and on since 2019.  She does have a cardiology appointment on Friday.  She was just seen at the end of last month for palpitations as well.  The patient reports that she does not want a chest x-ray because "I do not think it is pneumonia".  I discussed with her if she is having cough and cold symptoms that could make pneumonia higher on the differential.  She reports that she doesn't think this is what that is. She is also refusing the blood work in addition to the CXR. Both my myself and the paramedic were in the room for this conversation, we both discussed with her the reason and importance of a troponin and lab work as well as the CXR and EKG. She reports that she has had "abnormal EKGs" before when she looks at her MyChart and the read says "abnormal EKG". She reports that she just wants the EKG. The patient became tachycardic with her anxiety, but has normal rate before hand. She reports that she feels it is her anxiety and partner at bedside also agrees. She reports that she wants it in the documentation that she has chest pain so that when she sees the cardiologist, they can see how many times she has been in the ER for chest  pain. I have a low suspicion for PE as she does not have any history of PE/DVT.  Denies any personal history of cancer, unilateral leg swelling, OCP use or exogenous hormone use, any coagulopathy disorders, long travel, or recent surgery. She is not hypoxic and has been experiencing this pain off and on for years. She has been seen multiple times for chest pain and palpitations. On my physical exam, chest is nontender. Lungs are CTAB. She is speaking in full sentences satting well on room air. Additionally, the patient mentions that she has a congenital heart defect. When asked what this was, the patient became visibly frustrated and said it was a murmur.   EKG Interpretation Date/Time:  Tuesday January 11 2023 09:02:05 EST Ventricular Rate:  98 PR Interval:  190 QRS Duration:  81 QT Interval:  374 QTC Calculation: 478 R Axis:   39  Text Interpretation: Sinus rhythm no acute ST/T changes Tachycardia resolved from earlier in the day Confirmed by Pricilla Loveless [phone]) on 01/11/2023 9:04:10 AM  EKG does not show any acute ischemic changes per attending review.   I have discussed with her the risks of not getting the rest of the work up with the complaint for chest pain. We discussed the nature and purpose, risks and benefits, as well as, the alternatives of treatment. Time was given to allow the opportunity to ask  questions and consider their options, and after the discussion, the patient decided to refuse the offerred treatment. The patient was informed that refusal could lead to, but was not limited to, death, permanent disability, or severe pain. Partner/friend was present to dissuade them without success. Prior to refusing, I determined that the patient had the capacity to make their decision and understood the consequences of that decision. Lauren, EMT-P present, patient is A&Ox3. After refusal, I made every reasonable opportunity to treat them to the best of my ability.  The patient was notified  that they may return to the emergency department at any time for further treatment.    Patient was angered about the wait. I apologized and informed her that I had other sick patients. She voiced that she was concerned that she has been having a high heart rate and that no one was concerned. Discussed with her that her heart rate has been elevated when we were in the room. On cardiac monitoring outside, it does go lower. Again, discussed with her lab that labs were needed to further investigate this and she declined. She still declines the CXR. The patient reports that she was displeased with her service and the number for patient satisfaction was given. The patient left without new paperwork.   The patient will be leaving AMA for refusal for additional work up for chest pain.     Achille Rich, PA-C 01/11/23 1206    Pricilla Loveless, MD 01/12/23 [phone]

## 2023-01-11 NOTE — Discharge Instructions (Addendum)
As discussed, suspect you are having a spasm of your sternocleidomastoid muscle in the front part of her neck.  Will recommend treatment of this with muscle laxer as needed as well as anti-inflammatories.  Consider use range of motion exercises as well to help with symptoms.  Please do not hesitate to return to emergency department if there are worrisome signs and symptoms we discussed become apparent.

## 2023-01-11 NOTE — ED Notes (Addendum)
Pt is adamant that she wants CP listed on her AVS. The provider that was seeing had left for the day and the oncoming provider did not see her as her care was complete on the night shift PA leaving for the day. I informed Charge of the issue and she instructed me to tell the attending.

## 2023-01-14 ENCOUNTER — Encounter: Payer: Self-pay | Admitting: Internal Medicine

## 2023-01-14 ENCOUNTER — Ambulatory Visit: Payer: Self-pay | Attending: Internal Medicine | Admitting: Internal Medicine

## 2023-01-14 VITALS — BP 140/70 | HR 122 | Ht 65.0 in | Wt <= 1120 oz

## 2023-01-14 DIAGNOSIS — R002 Palpitations: Secondary | ICD-10-CM

## 2023-01-14 NOTE — Progress Notes (Signed)
  Cardiology Office Note:  .   Date:  01/14/2023  ID:  Alison Reyes, DOB 1990-12-15, MRN [id number] PCP: Patient, No Pcp Per  [facility] Providers Cardiologist:  Maisie Fus, MD    History of Present Illness: .   Alison Reyes is a 32 y.o. female who is seen in the emergency department for noncardiac chest pain on 01/11/2023 . She was diagnosed with a left sternocleidomastoid spasm.  She also reported chest pain and it was requested that she have a full cardiac workup.  She refused a workup and reported that she just wanted a diagnosis.  She notes her chest pain has been going on has been happening on and off since 2019. She was referred here to cardiology. She was told by her mother that she had a hole in her heart. She notes she is has dizzy spells and presyncopal events.  She had a prior workup at [facility] with cardiology.  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  She had a TEE that showed just a small PFO.  Patient was recommended to get a cardiac monitor.  Says she had a cardiac monitor that showed sinus rhythm with no arrhythmia.  She notes that she receives accommodation letter for her presyncope/syncope and is requesting for this letter today.  She is asking why she is not getting more testing.  Despite not actually really having a chief complaint.  She was quite irate and asking why she was not getting more testing.   ROS:  per HPI otherwise negative   Studies Reviewed: Marland Kitchen        EKG Interpretation Date/Time:  Friday January 14 2023 16:10:43 EST Ventricular Rate:  122 PR Interval:  166 QRS Duration:  68 QT Interval:  306 QTC Calculation: 436 R Axis:   51  Text Interpretation: Sinus tachycardia Possible Left atrial enlargement When compared with ECG of 11-Jan-2023 09:02, PREVIOUS ECG IS PRESENT Confirmed by Carolan Clines ([id number]) on 01/14/2023 4:14:43 PM  Risk  Assessment/Calculations:    Physical Exam:   VS:  Vitals:   01/14/23 1548 01/14/23 1612  BP: 104/70 (!) 140/70  Pulse: (!) 122     Wt Readings from Last 3 Encounters:  01/11/23 134 lb 14.7 oz (61.2 kg)  09/05/21 135 lb (61.2 kg)  11/15/20 135 lb (61.2 kg)    GEN: Well nourished, well developed in no acute distress NECK: No JVD; No carotid bruits CARDIAC: RRR, no murmurs, rubs, gallops RESPIRATORY:  Clear to auscultation without rales, wheezing or rhonchi  ABDOMEN: Soft, non-tender, non-distended EXTREMITIES:  No edema; No deformity   ASSESSMENT AND PLAN: .   PFO Had a TEE, that showed a small PFO.  This is benign.  This does not require surveillance  Sinus tachycardia Patient had MJ smell may be related.  She has had a prior cardiac monitor which showed no arrhythmia in 2019.  There is no documentation of any significant syncopal episodes since then.    Dispo: Would prefer she follow-up at [facility] for any accommodation letters needed considering that is who completed her entire cardiac workup.  We provided her with a list of primary care doctors as well  Because she did not get any testing she requested that this visit be a no charge.  I did not have any issues with this   Signed, Madesyn Ast, Alben Spittle, MD

## 2023-01-14 NOTE — Patient Instructions (Addendum)
Medication Instructions:  No changes     Lab Work: None     Testing/Procedures: None    Follow-Up: At [facility], you and your health needs are our priority.  As part of our continuing mission to provide you with exceptional heart care, we have created designated Provider Care Teams.  These Care Teams include your primary Cardiologist (physician) and Advanced Practice Providers (APPs -  Physician Assistants and Nurse Practitioners) who all work together to provide you with the care you need, when you need it.   Your next appointment:   Follow up as needed   Provider:   Dr.Branch   Other Instructions We recommend that you get in touch with Aldona Bar at [facility] cardiology to get your accommodation letter

## 2023-01-23 ENCOUNTER — Emergency Department (HOSPITAL_BASED_OUTPATIENT_CLINIC_OR_DEPARTMENT_OTHER): Payer: Self-pay

## 2023-01-23 ENCOUNTER — Emergency Department (HOSPITAL_BASED_OUTPATIENT_CLINIC_OR_DEPARTMENT_OTHER)
Admission: EM | Admit: 2023-01-23 | Discharge: 2023-01-23 | Disposition: A | Discharge status: Home / Self Care | Payer: Self-pay | Attending: Emergency Medicine | Admitting: Emergency Medicine

## 2023-01-23 ENCOUNTER — Encounter (HOSPITAL_BASED_OUTPATIENT_CLINIC_OR_DEPARTMENT_OTHER): Payer: Self-pay

## 2023-01-23 ENCOUNTER — Emergency Department (HOSPITAL_BASED_OUTPATIENT_CLINIC_OR_DEPARTMENT_OTHER): Payer: Self-pay | Admitting: Radiology

## 2023-01-23 ENCOUNTER — Other Ambulatory Visit: Payer: Self-pay

## 2023-01-23 DIAGNOSIS — Z87891 Personal history of nicotine dependence: Secondary | ICD-10-CM | POA: Insufficient documentation

## 2023-01-23 DIAGNOSIS — F419 Anxiety disorder, unspecified: Secondary | ICD-10-CM | POA: Insufficient documentation

## 2023-01-23 DIAGNOSIS — M7918 Myalgia, other site: Secondary | ICD-10-CM | POA: Insufficient documentation

## 2023-01-23 DIAGNOSIS — R911 Solitary pulmonary nodule: Secondary | ICD-10-CM

## 2023-01-23 DIAGNOSIS — R609 Edema, unspecified: Secondary | ICD-10-CM

## 2023-01-23 DIAGNOSIS — R4589 Other symptoms and signs involving emotional state: Secondary | ICD-10-CM | POA: Insufficient documentation

## 2023-01-23 DIAGNOSIS — R0602 Shortness of breath: Secondary | ICD-10-CM

## 2023-01-23 DIAGNOSIS — R55 Syncope and collapse: Secondary | ICD-10-CM

## 2023-01-23 DIAGNOSIS — R600 Localized edema: Secondary | ICD-10-CM | POA: Insufficient documentation

## 2023-01-23 LAB — URINALYSIS, ROUTINE W REFLEX MICROSCOPIC
Bilirubin Urine: NEGATIVE
Glucose, UA: NEGATIVE mg/dL
Hgb urine dipstick: NEGATIVE
Ketones, ur: 40 mg/dL — AB
Leukocytes,Ua: NEGATIVE
Nitrite: POSITIVE — AB
Specific Gravity, Urine: 1.033 — ABNORMAL HIGH (ref 1.005–1.030)
pH: 6.5 (ref 5.0–8.0)

## 2023-01-23 LAB — COMPREHENSIVE METABOLIC PANEL
ALT: 12 U/L (ref 0–44)
AST: 14 U/L — ABNORMAL LOW (ref 15–41)
Albumin: 5 g/dL (ref 3.5–5.0)
Alkaline Phosphatase: 81 U/L (ref 38–126)
Anion gap: 9 (ref 5–15)
BUN: 15 mg/dL (ref 6–20)
CO2: 29 mmol/L (ref 22–32)
Calcium: 9.9 mg/dL (ref 8.9–10.3)
Chloride: 97 mmol/L — ABNORMAL LOW (ref 98–111)
Creatinine, Ser: 0.79 mg/dL (ref 0.44–1.00)
GFR, Estimated: >60 mL/min (ref >60)
Glucose, Bld: 110 mg/dL — ABNORMAL HIGH (ref 70–99)
Potassium: 4 mmol/L (ref 3.5–5.1)
Sodium: 135 mmol/L (ref 135–145)
Total Bilirubin: 0.4 mg/dL (ref <1.2)
Total Protein: 8.6 g/dL — ABNORMAL HIGH (ref 6.5–8.1)

## 2023-01-23 LAB — CBC WITH DIFFERENTIAL/PLATELET
Abs Immature Granulocytes: 0.02 10*3/uL (ref 0.00–0.07)
Basophils Absolute: 0 10*3/uL (ref 0.0–0.1)
Basophils Relative: 0 %
Eosinophils Absolute: 0.1 10*3/uL (ref 0.0–0.5)
Eosinophils Relative: 1 %
HCT: 39.6 % (ref 36.0–46.0)
Hemoglobin: 13.3 g/dL (ref 12.0–15.0)
Immature Granulocytes: 0 %
Lymphocytes Relative: 21 %
Lymphs Abs: 2.1 10*3/uL (ref 0.7–4.0)
MCH: 30.6 pg (ref 26.0–34.0)
MCHC: 33.6 g/dL (ref 30.0–36.0)
MCV: 91.2 fL (ref 80.0–100.0)
Monocytes Absolute: 0.6 10*3/uL (ref 0.1–1.0)
Monocytes Relative: 6 %
Neutro Abs: 7.1 10*3/uL (ref 1.7–7.7)
Neutrophils Relative %: 72 %
Platelets: 434 10*3/uL — ABNORMAL HIGH (ref 150–400)
RBC: 4.34 MIL/uL (ref 3.87–5.11)
RDW: 11.2 % — ABNORMAL LOW (ref 11.5–15.5)
WBC: 10 10*3/uL (ref 4.0–10.5)
nRBC: 0 % (ref 0.0–0.2)

## 2023-01-23 LAB — TROPONIN I (HIGH SENSITIVITY): Troponin I (High Sensitivity): <2 ng/L (ref <18)

## 2023-01-23 LAB — TSH: TSH: 2.692 u[IU]/mL (ref 0.350–4.500)

## 2023-01-23 LAB — PREGNANCY, URINE: Preg Test, Ur: NEGATIVE

## 2023-01-23 LAB — D-DIMER, QUANTITATIVE: D-Dimer, Quant: 0.58 ug{FEU}/mL — ABNORMAL HIGH (ref 0.00–0.50)

## 2023-01-23 MED ORDER — IOHEXOL 350 MG/ML SOLN
75.0000 mL | Freq: Once | INTRAVENOUS | Status: AC | PRN
  Administered 2023-01-23: 75 mL via INTRAVENOUS

## 2023-01-23 NOTE — ED Provider Notes (Signed)
Alison Reyes EMERGENCY DEPARTMENT AT [facility] Provider Note  CSN: [id number] Arrival date & time: 01/23/23 0444  Chief Complaint(s) Generalized Body Aches  HPI Alison Reyes is a 32 y.o. female with past medical history as below, significant for anxiety, PFO who presents to the ED with complaint of multiple complaints  She was evaluated 11/20 at OSH concern for leg swelling and syncope.  No visible swelling on legs per chart review.  Workup was stable  Patient reports that she has been having intermittent swelling to various parts of her body.  Sometimes has swelling to her feet or her knees or her hip, sometimes to her face or neck.  Swelling will occur randomly and resolve spontaneously.  She also reports some discoloration to her hands and feet intermittently, feels like she can see the veins in her feet more clearly than she has in the past.  Think she is having some bruising to her face and neck which resolved spontaneously.  Report that 1 side of her face looked green at some point then resolved.  Feels that her skin around her eyes is darker at times than it has been previously.  Reports poor appetite intermittently.  Had unwitnessed syncopal episode a few days ago.  Occasional rhinorrhea.  She is requesting blood work to evaluate her liver.  She is scheduled to see PCP in January.  She is here with spouse  Past Medical History Past Medical History:  Diagnosis Date   Anxiety    Congenital heart defect    There are no problems to display for this patient.  Home Medication(s) Prior to Admission medications   Medication Sig Start Date End Date Taking? Authorizing Provider  EPINEPHrine 0.3 mg/0.3 mL IJ SOAJ injection Inject 0.3 mg into the muscle as needed for anaphylaxis. 04/23/20   Mardella Layman, MD  ondansetron (ZOFRAN-ODT) 8 MG disintegrating tablet Take 1 tablet (8 mg total) by mouth every 8 (eight) hours as needed. 12/23/22   Zadie Rhine, MD  diphenhydrAMINE  (BENADRYL) 25 MG tablet Take 25 mg by mouth as needed (for allergic reactions).   01/22/20  [provider]                                                                                                                                    Past Surgical History Past Surgical History:  Procedure Laterality Date   arm fracture surgery     Family History Family History  Problem Relation Age of Onset   Healthy Mother    Healthy Father    Lupus Sister    Lupus Sister    Diabetes Maternal Grandmother    Diabetes Paternal Grandmother    Diabetes Maternal Aunt     Social History Social History   Tobacco Use   Smoking status: Former    Types: Cigarettes   Smokeless tobacco: Never  Vaping Use   Vaping status: Never Used  Substance Use Topics   Alcohol use: Not Currently   Drug use: Not Currently    Types: Marijuana   Allergies Benzocaine, Shellfish allergy, Benzocaine-benzalkonium cl, Carbamide peroxide, Prednisone, and Nickel  Review of Systems Review of Systems  Constitutional:  Positive for appetite change.  HENT:  Negative for trouble swallowing.   Eyes:  Negative for visual disturbance.  Respiratory:  Negative for chest tightness and shortness of breath.   Cardiovascular:  Positive for leg swelling. Negative for chest pain and palpitations.  Gastrointestinal:  Negative for nausea and vomiting.  Genitourinary:  Negative for dysuria and flank pain.  Musculoskeletal:  Positive for arthralgias and joint swelling.  Neurological:  Positive for syncope. Negative for dizziness.  All other systems reviewed and are negative.   Physical Exam Vital Signs  I have reviewed the triage vital signs BP (!) 151/99 (BP Location: Left Arm)   Pulse 70   Temp 98.3 F (36.8 C) (Oral)   Resp 19   Wt 77.1 kg   LMP 09/15/2022 (Exact Date)   SpO2 100%   BMI 28.29 kg/m  Physical Exam Vitals and nursing note reviewed.  Constitutional:      General: She is not in acute  distress.    Appearance: Normal appearance. She is well-developed. She is not ill-appearing.  HENT:     Head: Normocephalic and atraumatic.     Right Ear: External ear normal.     Left Ear: External ear normal.     Nose: Nose normal.     Mouth/Throat:     Mouth: Mucous membranes are moist.  Eyes:     General: No scleral icterus.       Right eye: No discharge.        Left eye: No discharge.  Cardiovascular:     Rate and Rhythm: Normal rate.  Pulmonary:     Effort: Pulmonary effort is normal. No respiratory distress.     Breath sounds: No stridor.  Abdominal:     General: Abdomen is flat. There is no distension.     Tenderness: There is no guarding.  Musculoskeletal:        General: No deformity.     Cervical back: No rigidity.     Right lower leg: No edema.     Left lower leg: No edema.  Skin:    General: Skin is warm and dry.     Coloration: Skin is not cyanotic, jaundiced or pale.  Neurological:     Mental Status: She is alert.     GCS: GCS eye subscore is 4. GCS verbal subscore is 5. GCS motor subscore is 6.     Cranial Nerves: No dysarthria or facial asymmetry.     Motor: No tremor.  Psychiatric:        Mood and Affect: Mood is anxious. Affect is labile.        Behavior: Behavior is hyperactive. Behavior is cooperative.     ED Results and Treatments Labs (all labs ordered are listed, but only abnormal results are displayed) Labs Reviewed  CBC WITH DIFFERENTIAL/PLATELET  COMPREHENSIVE METABOLIC PANEL  TSH  Radiology No results found.  Pertinent labs & imaging results that were available during my care of the patient were reviewed by me and considered in my medical decision making (see MDM for details).  Medications Ordered in ED Medications - No data to display                                                                                                                                    Procedures Procedures  (including critical care time)  Medical Decision Making / ED Course    Medical Decision Making:    [name] is a 32 y.o. female with past medical history as below, significant for anxiety, PFO who presents to the ED with complaint of multiple complaints. The complaint involves an extensive differential diagnosis and also carries with it a high risk of complications and morbidity.  Serious etiology was considered. Ddx includes but is not limited to: Metabolic derangement, endocrine disturbance, infectious, electrolyte derangement, dependent edema, etc.  Complete initial physical exam performed, notably the patient was in no acute distress, HDS, no hypoxia, exam is stable.    Reviewed and confirmed nursing documentation for past medical history, family history, social history.  Vital signs reviewed.    Will get screening labs     Brief summary: 33 year old female with history as above here with multiple complaints.  Patient with intermittent swelling and bruising, possible syncopal episode over the past week, skin color changes.  Patient showed me multiple pictures of the reported abnormalities, on the pictures and videos I do not see any obvious physical abnormalities.  She currently has no swelling.  She reports that just prior to when I entered the room the swelling subsided.   She was evaluated 11/20 at OSH concern for leg swelling and syncope.  No visible swelling on legs per chart review.  Workup was stable.  She was also seen 10/23 with complaint of extremity swelling, workup was stable at that time.  She has been seen multiple times in the past with similar complaints of bodyaches, intermittent swelling, syncope/near syncope.  She is concerned that maybe she has lupus as a sibling was diagnosed with it is requesting lupus testing.  She has been seen by cardiology in regards to syncope/presyncope, see  note 01/14/23 Dr Wyline Mood.  My suspicion for an acute emergent condition is low at this time. I do not believe the patient requires any acute imaging.   Recommend patient follow-up with PCP.  Handoff to incoming EDP pending labs and recheck.        Additional history obtained: -Additional history obtained from spouse -External records from outside source obtained and reviewed including: Chart review including previous notes, labs, imaging, consultation notes including  Home medications Prior ED visit Prior labs Prior cardiac monitoring with sinus sinus rhythm, prior TEE with small PFO   Lab Tests: -I ordered, reviewed, and interpreted labs.   The pertinent results  include:   Labs Reviewed  CBC WITH DIFFERENTIAL/PLATELET  COMPREHENSIVE METABOLIC PANEL  TSH    Notable for labs pending  EKG   EKG Interpretation Date/Time:    Ventricular Rate:    PR Interval:    QRS Duration:    QT Interval:    QTC Calculation:   R Axis:      Text Interpretation:           Imaging Studies ordered: na   Medicines ordered and prescription drug management: No orders of the defined types were placed in this encounter.   -I have reviewed the patients home medicines and have made adjustments as needed   Consultations Obtained: na   Cardiac Monitoring: Continuous pulse oximetry interpreted by myself, 100% on RA.    Social Determinants of Health:  Diagnosis or treatment significantly limited by social determinants of health: former smoker, no pcp but will see in 1/25   Reevaluation: After the interventions noted above, I reevaluated the patient and found that they have stayed the same  Co morbidities that complicate the patient evaluation  Past Medical History:  Diagnosis Date   Anxiety    Congenital heart defect       Dispostion: Disposition decision including need for hospitalization was considered, and patient disposition pending at time of sign  out.    Final Clinical Impression(s) / ED Diagnoses Final diagnoses:  Edema, unspecified type        Sloan Leiter, DO 01/23/23 1610

## 2023-01-23 NOTE — ED Provider Notes (Signed)
  Physical Exam  BP (!) 150/90 (BP Location: Left Arm)   Pulse 81   Temp 98.5 F (36.9 C) (Oral)   Resp 13   Ht 5\' 6"  (1.676 m)   Wt 77.1 kg   LMP 09/15/2022 (Exact Date)   SpO2 98%   BMI 27.44 kg/m   Physical Exam  Procedures  Procedures  ED Course / MDM      Received care of patient from Dr. Wallace Cullens. Please see his note for prior hx an care.  Labs completed about interpreted by me show no anemia, no leukocytosis, no clinically significant electrolyte abnormalities.  Troponin is within normal limits and low suspicion for ACS.  TSH is within normal limits . Reviewed BNP from [facility] which was normal and doubt CHF.  D-dimer is positive and a CT PE study was ordered.  UA positive for nitrites but otherwise not consistent iwht infection, trace proteinuria noted in setting of her concerns for waxing/waning edema.  Reports that she had a syncopal episode and thinks she may have hit her head, has had continued headaches and a CT head was performed to evaluate for other intracranial abnormalities and showed no acute abnormalities.  CT PE study shows no evidence of PE.  Does show 2 small bilateral lung nodules-recommend primary care follow-up regarding lung nodules, as well as her other symptoms.  Do not see emergent etiology of her symptoms to require admission or surgery.  Recommend continued outpatient follow-up.   Alvira Monday, MD 01/23/23 2219

## 2023-01-23 NOTE — ED Triage Notes (Signed)
PT to triage via wheelchair c/o multiple complaints. PT speaking erratically. No unilateral deficits. PT denies ETOH or drugs prior to arrival.  VSS NAD PT on room air. Pt a/o x 4.

## 2023-01-23 NOTE — ED Notes (Signed)
Patient transported to radiology

## 2023-01-23 NOTE — Discharge Instructions (Signed)
Please keep legs elevated when sitting down, use compression stockings daily. Get plenty of rest. Follow up with PCP  It was a pleasure caring for you today in the emergency department.  Please return to the emergency department for any worsening or worrisome symptoms.

## 2023-01-31 ENCOUNTER — Emergency Department (HOSPITAL_COMMUNITY)
Admission: EM | Admit: 2023-01-31 | Discharge: 2023-01-31 | Disposition: A | Discharge status: Home / Self Care | Payer: Self-pay | Attending: Emergency Medicine | Admitting: Emergency Medicine

## 2023-01-31 DIAGNOSIS — R630 Anorexia: Secondary | ICD-10-CM | POA: Insufficient documentation

## 2023-01-31 DIAGNOSIS — M7989 Other specified soft tissue disorders: Secondary | ICD-10-CM | POA: Insufficient documentation

## 2023-01-31 DIAGNOSIS — R079 Chest pain, unspecified: Secondary | ICD-10-CM | POA: Insufficient documentation

## 2023-01-31 NOTE — ED Triage Notes (Signed)
Pt to the ed from UC via ems with cc of chest wall pain x 1.5 months. Pt relays she feel like she is having a heart attack. Pt relays she has had a elevated d dimer in the past. Pt denies sob, loc, recent trauma.

## 2023-01-31 NOTE — ED Provider Notes (Signed)
I saw and evaluated the patient, reviewed the resident's note and I agree with the findings and plan.    Reports that she was sent from urgent care via EMS for swelling in her legs.  She was previously worked up in the emergency department and had a PE study that was negative. [id number].  Patient reports that she was doing her follow-up at urgent care to get established with a primary provider.  She did not intend to come to the emergency department.  Patient reports that her symptom has been swelling and congestion of the veins in her feet.  She describes the veins hind her ankles and around the soles of her feet is getting notably congested when she has been standing or her legs have been hanging.  She reports sometimes she gets pain along the front of her shins and then she also illustrate sometimes getting pain around the anterior aspect of the knee in the suprapatellar and infrapatellar region.  She reports right now she is completely asymptomatic.  Patient is alert and nontoxic.  No respiratory distress at rest.  Lower extremity exam is normal.  There are no palpable cords and no venous congestion evident at this time.  Calves are soft and pliable, lower extremities are symmetric.  There are no active wounds.  No tenderness in the popliteal fossae.  No effusions of the knees.  My discussion with the patient and reviewing some of the images she showed me, she gets some venous congestion of the superficial vessels in dependent position.  She clearly describes this is resolving when her legs are elevated.  She reported the elevated D-dimer that had precipitated concern, I have reviewed this and it was at 0.58.  Patient had a negative PE study.  At this time I have low suspicion for DVT.  I do not feel that patient needs emergent lower extremity ultrasounds.  I do feel she is stable to continue working with the PCP regarding several complaints of waxing and waning chest pain and lower extremity swelling.   Stable for discharge at this time and appropriate for outpatient follow up.   Arby Barrette, MD 01/31/23 1655

## 2023-01-31 NOTE — ED Provider Notes (Signed)
Alison Reyes Provider Note   CSN: [id number] Arrival date & time: 01/31/23  1550     History  Chief Complaint  Patient presents with   Chest Pain    Alison Reyes is a 32 y.o. female with a PMH of anxiety, PFO who presented to the ED for multiple complaints.  She reports she was in an [place] going to urgent care today, when she arrived in states that [place] driver thought her heart was beating quickly, so he got the doctor to see her and they called EMS to bring her to the ER.  She currently reports she has been having her chronic chest pain over her entire chest, but cannot describe it further.  She also endorses her chronic shortness of breath.  Denies lightheadedness, dizziness, abdominal pain, vomiting, or diarrhea.  Endorses a decreased appetite and her hands and feet changing colors.  She endorses chronic bilateral leg swelling that gets worse when she stands or sits for a long time.  She otherwise denies fevers, cough, states she has occasionally been congested recently.  Reports she was seen here recently and had an elevated D-dimer, and a CT of her chest that showed no blood clot but 2 lung nodules.   Chest Pain      Home Medications Prior to Admission medications   Medication Sig Start Date End Date Taking? Authorizing Provider  EPINEPHrine 0.3 mg/0.3 mL IJ SOAJ injection Inject 0.3 mg into the muscle as needed for anaphylaxis. 04/23/20   Mardella Layman, MD  ondansetron (ZOFRAN-ODT) 8 MG disintegrating tablet Take 1 tablet (8 mg total) by mouth every 8 (eight) hours as needed. 12/23/22   Zadie Rhine, MD  diphenhydrAMINE (BENADRYL) 25 MG tablet Take 25 mg by mouth as needed (for allergic reactions).   01/22/20  [provider]      Allergies    Benzocaine, Shellfish allergy, Benzocaine-benzalkonium cl, Carbamide peroxide, Prednisone, and Nickel    Review of Systems   Review of Systems  Cardiovascular:  Positive  for chest pain.    Physical Exam Updated Vital Signs BP (!) 155/103 (BP Location: Right Arm)   Pulse 90   Temp 98.5 F (36.9 C) (Oral)   Resp 16   Ht 5\' 6"  (1.676 m)   Wt 77 kg   LMP 09/15/2022 (Exact Date)   SpO2 100%   BMI 27.40 kg/m  Physical Exam Constitutional:      General: She is not in acute distress.    Appearance: She is well-developed. She is not ill-appearing.  HENT:     Head: Normocephalic and atraumatic.  Eyes:     Extraocular Movements: Extraocular movements intact.     Pupils: Pupils are equal, round, and reactive to light.  Cardiovascular:     Rate and Rhythm: Normal rate and regular rhythm.     Heart sounds: No murmur heard.    No friction rub. No gallop.  Pulmonary:     Effort: Pulmonary effort is normal.     Breath sounds: No wheezing, rhonchi or rales.  Abdominal:     Tenderness: There is no abdominal tenderness. There is no guarding or rebound.  Musculoskeletal:     Cervical back: Normal range of motion and neck supple.     Right lower leg: No edema.     Left lower leg: No edema.     Comments: All 4 extremities appear grossly within normal limits, no erythema, peripheral edema in the legs bilaterally.  No popliteal fossa tenderness.  Bilateral DP pulses 2+.  Skin:    General: Skin is warm and dry.  Neurological:     General: No focal deficit present.     Mental Status: She is alert.     Comments: Cranial nerves II-XII intact.  Strength 5/5 and sensation intact in all 4 extremities    ED Results / Procedures / Treatments   Labs (all labs ordered are listed, but only abnormal results are displayed) Labs Reviewed - No data to display  EKG None  Radiology No results found.  Procedures Procedures    Medications Ordered in ED Medications - No data to display  ED Course/ Medical Decision Making/ A&P                                 Medical Decision Making  Vital signs stable, physical exam reassuring without evidence of leg swelling  bilaterally.  Patient requested I evaluate a specific spot on her leg, she tells me she is concerned she is a vein that is swollen in her thigh, and shows me this area, which appears to look and be palpated consistent with a tendon in her thigh.  I informed the patient of this, and she had me palpate further down the tendon, I again informed her that this was a tendon in her leg which is a normal part of her body.  She requested DVT ultrasound to evaluate this area, we discussed that this is not necessary in the emergency department at this time.  I have offered her blood work to evaluate her chest pain further, despite this seeming to be chronic in nature.  Patient declined blood work and chest x-ray.  I have low suspicion for DVT and suspect that her edema is likely secondary to the chronic dependent edema that she has been seen for previously.  I have low suspicion for ACS or PE, additionally patient had a negative CT PE 1 week prior.  Her chest pain sounds chronic in nature and consistent with her usual symptoms.  Patient expressed frustration she had come to the ER because she went to the urgent care to be established with a PCP, I offered empathy and agreed that it would be beneficial for her to establish care with a PCP in the area.  Strict return precautions were discussed with the patient, she voiced understanding.  She was discharged in stable condition.        Final Clinical Impression(s) / ED Diagnoses Final diagnoses:  Chest pain, unspecified type    Rx / DC Orders ED Discharge Orders     None         Janyth Pupa, MD 01/31/23 1734    Arby Barrette, MD 02/07/23 [phone]

## 2023-01-31 NOTE — Discharge Instructions (Signed)
You were seen here today for multiple complaints.  Please use all medications as prescribed.  Please follow-up with your PCP.  Please return to the emergency department for increasing chest pain, shortness of breath, severe vomiting or inability to keep down food, loss of consciousness, or any worsening symptom or concern.

## 2023-02-05 ENCOUNTER — Emergency Department (HOSPITAL_BASED_OUTPATIENT_CLINIC_OR_DEPARTMENT_OTHER)
Admission: EM | Admit: 2023-02-05 | Discharge: 2023-02-06 | Disposition: A | Discharge status: Home / Self Care | Payer: Self-pay | Attending: Emergency Medicine | Admitting: Emergency Medicine

## 2023-02-05 ENCOUNTER — Other Ambulatory Visit: Payer: Self-pay

## 2023-02-05 DIAGNOSIS — M79642 Pain in left hand: Secondary | ICD-10-CM | POA: Insufficient documentation

## 2023-02-05 DIAGNOSIS — M25572 Pain in left ankle and joints of left foot: Secondary | ICD-10-CM | POA: Insufficient documentation

## 2023-02-05 DIAGNOSIS — R1030 Lower abdominal pain, unspecified: Secondary | ICD-10-CM | POA: Insufficient documentation

## 2023-02-05 DIAGNOSIS — R52 Pain, unspecified: Secondary | ICD-10-CM

## 2023-02-05 DIAGNOSIS — M79641 Pain in right hand: Secondary | ICD-10-CM | POA: Insufficient documentation

## 2023-02-05 DIAGNOSIS — M25571 Pain in right ankle and joints of right foot: Secondary | ICD-10-CM | POA: Insufficient documentation

## 2023-02-05 NOTE — ED Triage Notes (Signed)
Pt POV reporting L side chest pain and palpitations that began tonight, also reporting gen body swelling, began in her legs. Seen on 12/2 for similar sx, also outpatient DVT study neg.

## 2023-02-06 ENCOUNTER — Emergency Department (HOSPITAL_BASED_OUTPATIENT_CLINIC_OR_DEPARTMENT_OTHER): Payer: Self-pay

## 2023-02-06 MED ORDER — LIDOCAINE 5 % EX PTCH: 1.0000 | MEDICATED_PATCH | CUTANEOUS | 0 refills | Status: AC

## 2023-02-06 MED ORDER — LIDOCAINE 5 % EX PTCH: 1.0000 | MEDICATED_PATCH | CUTANEOUS | 0 refills | Status: DC

## 2023-02-06 MED ORDER — LIDOCAINE 5 % EX PTCH
3.0000 | MEDICATED_PATCH | CUTANEOUS | Status: DC
  Administered 2023-02-06: 3 via TRANSDERMAL
  Filled 2023-02-06: qty 3

## 2023-02-06 MED ORDER — DICLOFENAC SODIUM 1 % EX GEL: 4.0000 g | Freq: Four times a day (QID) | CUTANEOUS | 0 refills | Status: AC

## 2023-02-06 MED ORDER — DEXAMETHASONE SODIUM PHOSPHATE 10 MG/ML IJ SOLN
10.0000 mg | Freq: Once | INTRAMUSCULAR | Status: AC
  Administered 2023-02-06: 10 mg via INTRAMUSCULAR
  Filled 2023-02-06: qty 1

## 2023-02-06 NOTE — ED Provider Notes (Signed)
I cannot find swelling on exam.  I have examined and reexamined multiple areas of the body and cannot find swelling or color change or coldness.  I have informed the patient multiple times that she will need to follow up as an outpatient.  I stated rheumatology may provide some answers in addition to a family doctor but patient repeatedly says they won't do anything.  I explained I cannot refer to doctors that are not on call for hospital but patient may perform an independent search for this.  She verbalized understanding.  Patient's nurse now informs me that patient is refusing discharge as I have not referred her to rheumatology.  Moreover, the "coldness" of the abdominal wall is still present.  I could not find this on exam.  Nurse also reports that patient is now concerned about nodules seen on previous imaging that were ruled benign and why we aren't re-imaging these.  Patient has been thoroughly examined and recently ruled out for PE.  Has had echo and cardiology follow up and normal outpatient DVT studies.  She will need to follow up as an outpatient.     Kyshon Tolliver, MD 02/06/23 [phone]

## 2023-02-06 NOTE — ED Notes (Signed)
PT refusing to leave following being DC from ER. Pt requested Dr to return to room for new chief complaints. Pt VSS NAD PT on room air. Dr. Nicanor Alcon notified of same.

## 2023-02-06 NOTE — ED Provider Notes (Signed)
[place] EMERGENCY DEPARTMENT AT [facility] Provider Note   CSN: [id number] Arrival date & time: 02/05/23  2245     History  Chief Complaint  Patient presents with   Migratory Pain     Alison Reyes is a 32 y.o. female.  The history is provided by the patient and a friend.  Illness Location:  Legs, hand lower abdomen Quality:  Migratory pain and swelling Onset quality:  Gradual Progression:  Waxing and waning Chronicity:  New Relieved by:  Nothing Worsened by:  Standing and certain positions Associated symptoms: no fever, no rash, no vomiting and no wheezing   Risk factors:  None Patient presents with multiple concerns including migratory pain and swelling including ankles, hands and lower abdomen.  She is also concerned about lack of menstrual cycle since July.  Patient has been seen for this in the past, but is having ongoing symptoms and has not been told why it is happening.       Home Medications Prior to Admission medications   Medication Sig Start Date End Date Taking? Authorizing Provider  diclofenac Sodium (VOLTAREN) 1 % GEL Apply 4 g topically 4 (four) times daily. 02/06/23  Yes Offie Pickron, MD  lidocaine (LIDODERM) 5 % Place 1 patch onto the skin daily. Remove & Discard patch within 12 hours or as directed by MD 02/06/23  Yes Richard Holz, MD  EPINEPHrine 0.3 mg/0.3 mL IJ SOAJ injection Inject 0.3 mg into the muscle as needed for anaphylaxis. 04/23/20   Mardella Layman, MD  ondansetron (ZOFRAN-ODT) 8 MG disintegrating tablet Take 1 tablet (8 mg total) by mouth every 8 (eight) hours as needed. 12/23/22   Zadie Rhine, MD  diphenhydrAMINE (BENADRYL) 25 MG tablet Take 25 mg by mouth as needed (for allergic reactions).   01/22/20  [provider]      Allergies    Benzocaine, Shellfish allergy, Benzocaine-benzalkonium cl, Carbamide peroxide, Prednisone, and Nickel    Review of Systems   Review of Systems  Constitutional:  Negative  for fever.  HENT:  Negative for facial swelling.   Eyes:  Negative for redness.  Respiratory:  Negative for wheezing.   Gastrointestinal:  Negative for vomiting.  Skin:  Negative for rash.  Neurological:  Negative for weakness.  All other systems reviewed and are negative.   Physical Exam Updated Vital Signs BP 133/81 (BP Location: Right Arm)   Pulse (!) 102   Temp 98.4 F (36.9 C)   Resp 16   Ht 5\' 6"  (1.676 m)   Wt 74.4 kg   LMP 09/15/2022 (Exact Date)   SpO2 100%   BMI 26.47 kg/m  Physical Exam Vitals reviewed. Exam conducted with a chaperone present (nurse present during entirety of history and physical).  Constitutional:      General: She is not in acute distress.    Appearance: Normal appearance. She is well-developed.  HENT:     Head: Normocephalic and atraumatic.     Nose: Nose normal.     Mouth/Throat:     Mouth: Mucous membranes are moist.     Pharynx: Oropharynx is clear. No oropharyngeal exudate or posterior oropharyngeal erythema.  Eyes:     Pupils: Pupils are equal, round, and reactive to light.  Cardiovascular:     Rate and Rhythm: Normal rate and regular rhythm.     Pulses: Normal pulses.     Heart sounds: Normal heart sounds.  Pulmonary:     Effort: Pulmonary effort is normal. No respiratory distress.  Breath sounds: Normal breath sounds.  Abdominal:     General: Bowel sounds are normal.     Palpations: Abdomen is soft. There is no mass.     Tenderness: There is no guarding or rebound.     Hernia: No hernia is present.  Musculoskeletal:        General: Normal range of motion.     Right hand: Normal capillary refill. Normal pulse.     Left hand: Normal capillary refill. Normal pulse.     Cervical back: Neck supple.     Right lower leg: Normal.     Left lower leg: Normal.     Right ankle: No swelling, deformity or ecchymosis. Normal range of motion. Normal pulse.     Right Achilles Tendon: Normal.     Left ankle: No swelling, deformity or  ecchymosis. Normal range of motion. Normal pulse.     Left Achilles Tendon: Normal.     Right foot: Normal capillary refill. No crepitus. Normal pulse.     Left foot: Normal capillary refill. No crepitus. Normal pulse.  Skin:    General: Skin is warm and dry.     Capillary Refill: Capillary refill takes less than 2 seconds.     Coloration: Skin is not jaundiced or pale.     Findings: No bruising, erythema, lesion or rash.     Comments: Normal color   Neurological:     General: No focal deficit present.     Mental Status: She is alert and oriented to person, place, and time.     Deep Tendon Reflexes: Reflexes normal.     ED Results / Procedures / Treatments   Labs (all labs ordered are listed, but only abnormal results are displayed)  EKG EKG Interpretation Date/Time:  Saturday February 05 2023 23:04:42 EST Ventricular Rate:  91 PR Interval:  168 QRS Duration:  80 QT Interval:  372 QTC Calculation: 457 R Axis:   40  Text Interpretation: Normal sinus rhythm with sinus arrhythmia Confirmed by Celes Dedic ([id number]) on 02/05/2023 11:58:10 PM  Radiology DG Chest Port 1 View  Result Date: 02/06/2023 CLINICAL DATA:  Chest pain EXAM: PORTABLE CHEST 1 VIEW COMPARISON:  01/23/2023 FINDINGS: The heart size and mediastinal contours are within normal limits. Both lungs are clear. The visualized skeletal structures are unremarkable. IMPRESSION: Normal study. Electronically Signed   By: Charlett Nose M.D.   On: 02/06/2023 00:24    Procedures Procedures    Medications Ordered in ED Medications  lidocaine (LIDODERM) 5 % 3 patch (3 patches Transdermal Patch Applied 02/06/23 0025)  dexamethasone (DECADRON) injection 10 mg (10 mg Intramuscular Given 02/06/23 0025)    ED Course/ Medical Decision Making/ A&P                                 Medical Decision Making Patient with migratory pain in joints and lower abdomen   Amount and/or Complexity of Data Reviewed Independent Historian:  friend    Details: See above  External Data Reviewed: labs, radiology and notes.    Details: Previous ED visits reviewed  Labs: ordered. Radiology: ordered and independent interpretation performed.    Details: Normal CXR  ECG/medicine tests: ordered and independent interpretation performed. Decision-making details documented in ED Course.  Risk Prescription drug management. Risk Details: Patient is having migratory pain and symptoms, has been ruled out for PE and DVTs.  Patient's differential includes rheumatologic illness. No Reynaud's  on exam.  Patient will need to follow up with PMD, rheumatology and GYN to discuss lack of period.  Stable for discharge     Final Clinical Impression(s) / ED Diagnoses Final diagnoses:  Migratory pain   Return for intractable cough, coughing up blood, fevers > 100.4 unrelieved by medication, shortness of breath, intractable vomiting, chest pain, shortness of breath, weakness, numbness, changes in speech, facial asymmetry, abdominal pain, passing out, Inability to tolerate liquids or food, cough, altered mental status or any concerns. No signs of systemic illness or infection. The patient is nontoxic-appearing on exam and vital signs are within normal limits.  I have reviewed the triage vital signs and the nursing notes. Pertinent labs & imaging results that were available during my care of the patient were reviewed by me and considered in my medical decision making (see chart for details). After history, exam, and medical workup I feel the patient has been appropriately medically screened and is safe for discharge home. Pertinent diagnoses were discussed with the patient. Patient was given return precautions.             Austina Constantin, MD 02/06/23 1610

## 2023-02-09 ENCOUNTER — Ambulatory Visit: Payer: Self-pay | Admitting: Obstetrics and Gynecology

## 2023-03-28 ENCOUNTER — Emergency Department (HOSPITAL_COMMUNITY)
Admission: EM | Admit: 2023-03-28 | Discharge: 2023-03-29 | Disposition: A | Discharge status: Home / Self Care | Payer: No Typology Code available for payment source | Attending: Emergency Medicine | Admitting: Emergency Medicine

## 2023-03-28 ENCOUNTER — Emergency Department (HOSPITAL_COMMUNITY): Payer: No Typology Code available for payment source

## 2023-03-28 DIAGNOSIS — R000 Tachycardia, unspecified: Secondary | ICD-10-CM | POA: Insufficient documentation

## 2023-03-28 LAB — CBC
HCT: 41.6 % (ref 36.0–46.0)
Hemoglobin: 13.9 g/dL (ref 12.0–15.0)
MCH: 30.8 pg (ref 26.0–34.0)
MCHC: 33.4 g/dL (ref 30.0–36.0)
MCV: 92 fL (ref 80.0–100.0)
Platelets: 345 10*3/uL (ref 150–400)
RBC: 4.52 MIL/uL (ref 3.87–5.11)
RDW: 12 % (ref 11.5–15.5)
WBC: 12.5 10*3/uL — ABNORMAL HIGH (ref 4.0–10.5)
nRBC: 0 % (ref 0.0–0.2)

## 2023-03-28 MED ORDER — SODIUM CHLORIDE 0.9 % IV BOLUS
1000.0000 mL | Freq: Once | INTRAVENOUS | Status: AC
  Administered 2023-03-29: 1000 mL via INTRAVENOUS

## 2023-03-28 MED ORDER — ADENOSINE 6 MG/2ML IV SOLN: 6.0000 mg | Freq: Once | INTRAVENOUS | Status: DC

## 2023-03-28 NOTE — ED Triage Notes (Addendum)
Pt bib pov , pt not verbal in triage, only shaking head to yes and no , pt denies drug use. Pt has hx of cardiac issues. EKG and line placed in triage by EMTS, HR 171 , SVT appears on monitor , PA in room , pt taken to 23. RN unable to complete full triage d/t pt not answering questions. Eyes dilated @ 4/5 and minimal reactive to PA

## 2023-03-28 NOTE — ED Provider Notes (Signed)
[place] EMERGENCY DEPARTMENT AT [facility] Provider Note  CSN: [id number] Arrival date & time: 03/28/23 2323  Chief Complaint(s) Tachycardia  HPI Alison Reyes is a 33 y.o. female with a past medical history listed below including patent PFO, intermittent tachycardia, polysubstance use disorder here for tachycardia noted to have rates in the 170s by EMS.  Patient is shaky and not answering questions.  Only shaking her head yes and no to questions.  On review of record, patient was admitted to [facility] health on December 22 for similar episode and had extensive workup, including TEE noting the patent PFO with mild right to left shunting.  No thrombus noted.  Patient also ruled out for PE.  It did show that patient was positive for cocaine, opiates, THC.  Patient denies any cocaine or THC use today.  Patient states that she last used cocaine December 22.  Reports that she previously had a reaction to shrimp resulting in lip swelling several years ago but has subsequently eaten shrimp without any reaction.  She did report having shrimp 3 days ago without any symptoms.  No shrimp consumption today.  The history is provided by the patient and a significant other.    Past Medical History Past Medical History:  Diagnosis Date   Anxiety    Congenital heart defect    There are no active problems to display for this patient.  Home Medication(s) Prior to Admission medications   Medication Sig Start Date End Date Taking? Authorizing Provider  diclofenac Sodium (VOLTAREN) 1 % GEL Apply 4 g topically 4 (four) times daily. 02/06/23   Palumbo, April, MD  EPINEPHrine 0.3 mg/0.3 mL IJ SOAJ injection Inject 0.3 mg into the muscle as needed for anaphylaxis. 04/23/20   Mardella Layman, MD  lidocaine (LIDODERM) 5 % Place 1 patch onto the skin daily. Remove & Discard patch within 12 hours or as directed by MD 02/06/23   Nicanor Alcon, April, MD  ondansetron (ZOFRAN-ODT) 8 MG disintegrating tablet  Take 1 tablet (8 mg total) by mouth every 8 (eight) hours as needed. 12/23/22   Zadie Rhine, MD  diphenhydrAMINE (BENADRYL) 25 MG tablet Take 25 mg by mouth as needed (for allergic reactions).   01/22/20  [provider]                                                                                                                                    Allergies Benzocaine, Shellfish allergy, Benzocaine-benzalkonium cl, Carbamide peroxide, Prednisone, and Nickel  Review of Systems Review of Systems As noted in HPI  Physical Exam Vital Signs  I have reviewed the triage vital signs BP 117/66   Pulse (!) 113   Temp 98.5 F (36.9 C) (Oral)   Resp 13   SpO2 100%   Physical Exam Vitals reviewed.  Constitutional:      General: She is not in acute distress.    Appearance: She is  well-developed. She is not diaphoretic.  HENT:     Head: Normocephalic and atraumatic.     Nose: Nose normal.  Eyes:     General: No scleral icterus.       Right eye: No discharge.        Left eye: No discharge.     Conjunctiva/sclera: Conjunctivae normal.     Pupils: Pupils are equal, round, and reactive to light.  Cardiovascular:     Rate and Rhythm: Regular rhythm. Tachycardia present.     Heart sounds: No murmur heard.    No friction rub. No gallop.  Pulmonary:     Effort: Pulmonary effort is normal. No respiratory distress.     Breath sounds: Normal breath sounds. No stridor. No rales.  Abdominal:     General: There is no distension.     Palpations: Abdomen is soft.     Tenderness: There is no abdominal tenderness.  Musculoskeletal:        General: No tenderness.     Cervical back: Normal range of motion and neck supple.     Right lower leg: No edema.     Left lower leg: No edema.  Skin:    General: Skin is warm and dry.     Findings: No erythema or rash.     Comments: Flushed skin. Question mildly raised urticaria on abdomen.  Neurological:     Mental Status: She is alert and  oriented to person, place, and time.     ED Results and Treatments Labs (all labs ordered are listed, but only abnormal results are displayed) Labs Reviewed  BASIC METABOLIC PANEL - Abnormal; Notable for the following components:      Result Value   Sodium 132 (*)    Glucose, Bld 151 (*)    All other components within normal limits  CBC - Abnormal; Notable for the following components:   WBC 12.5 (*)    All other components within normal limits  RAPID URINE DRUG SCREEN, HOSP PERFORMED - Abnormal; Notable for the following components:   Cocaine POSITIVE (*)    Tetrahydrocannabinol POSITIVE (*)    All other components within normal limits  TROPONIN I (HIGH SENSITIVITY)  TROPONIN I (HIGH SENSITIVITY)                                                                                                                         EKG  EKG Interpretation Date/Time:  Monday March 28 2023 23:40:39 EST Ventricular Rate:  149 PR Interval:  133 QRS Duration:  73 QT Interval:  264 QTC Calculation: 416 R Axis:   65  Text Interpretation: Sinus tachycardia LAE, consider biatrial enlargement Confirmed by Drema Pry [phone]) on 03/29/2023 12:15:09 AM       Radiology DG Chest Port 1 View Result Date: 03/28/2023 CLINICAL DATA:  Tachycardia. EXAM: PORTABLE CHEST 1 VIEW COMPARISON:  02/06/2023 FINDINGS: The cardiomediastinal contours are normal. The lungs are clear. Pulmonary vasculature is normal. No consolidation, pleural  effusion, or pneumothorax. No acute osseous abnormalities are seen. IMPRESSION: Clear lungs.  No acute findings. Electronically Signed   By: Narda Rutherford M.D.   On: 03/28/2023 23:54    Medications Ordered in ED Medications  sodium chloride 0.9 % bolus 1,000 mL (0 mLs Intravenous Stopped 03/29/23 0108)  famotidine (PEPCID) IVPB 20 mg premix (0 mg Intravenous Stopped 03/29/23 0230)  diphenhydrAMINE (BENADRYL) injection 25 mg (25 mg Intravenous Given 03/29/23 0103)  sodium  chloride 0.9 % bolus 1,000 mL (0 mLs Intravenous Stopped 03/29/23 0301)   Procedures .Critical Care  Performed by: Nira Conn, MD Authorized by: Nira Conn, MD   Critical care provider statement:    Critical care time (minutes):  45   Critical care time was exclusive of:  Separately billable procedures and treating other patients   Critical care was necessary to treat or prevent imminent or life-threatening deterioration of the following conditions:  Circulatory failure and cardiac failure   Critical care was time spent personally by me on the following activities:  Development of treatment plan with patient or surrogate, discussions with consultants, evaluation of patient's response to treatment, examination of patient, obtaining history from patient or surrogate, review of old charts, re-evaluation of patient's condition, pulse oximetry, ordering and review of radiographic studies, ordering and review of laboratory studies and ordering and performing treatments and interventions   (including critical care time) Medical Decision Making / ED Course   Medical Decision Making Amount and/or Complexity of Data Reviewed Labs: ordered. Decision-making details documented in ED Course. Radiology: ordered and independent interpretation performed. Decision-making details documented in ED Course. ECG/medicine tests: ordered and independent interpretation performed. Decision-making details documented in ED Course.  Risk Prescription drug management.    Patient presents with sinus tachycardia.  Noted to have rates in the 170s to 180s.  Thought to be SVT on monitor.  On telemetry after patient was brought back to a treatment room, her heart rate was in the 130s to 150s and obvious sinus tachycardia.  No other dysrhythmias.  Differential diagnosis considered  Tachydysrhythmia, allergic reaction, substance use, infection, etc. low suspicion for pulmonary embolism.  Will  reconsider if patient does not improve with treatment.  EKG with sinus tachycardia.  Serial troponins negative x 2. CBC with mild leukocytosis. CMP with mild hyponatremia.  Hyperglycemia without DKA.  No other electrolyte derangements or renal sufficiency. UDS positive for cocaine and THC.  Patient was provided with IV Pepcid and Benadryl.  Also given 2 L IV fluids  Flushing/urticaria resolved.  Heart rate improved down to 90s and low 100s.    Final Clinical Impression(s) / ED Diagnoses Final diagnoses:  Sinus tachycardia   The patient appears reasonably screened and/or stabilized for discharge and I doubt any other medical condition or other [facility] requiring further screening, evaluation, or treatment in the ED at this time. I have discussed the findings, Dx and Tx plan with the patient/family who expressed understanding and agree(s) with the plan. Discharge instructions discussed at length. The patient/family was given strict return precautions who verbalized understanding of the instructions. No further questions at time of discharge.  Disposition: Discharge  Condition: Good  ED Discharge Orders     None         Follow Up: Primary care provider  Call    Therisa Doyne., MD [address] [place] [address] [phone]  Call  to schedule an appointment for close follow up    This chart was dictated using  voice recognition software.  Despite best efforts to proofread,  errors can occur which can change the documentation meaning.    Nira Conn, MD 03/29/23 [phone]

## 2023-03-28 NOTE — ED Provider Notes (Incomplete)
[place] EMERGENCY DEPARTMENT AT [facility] Provider Note  CSN: [id number] Arrival date & time: 03/28/23 2323  Chief Complaint(s) Tachycardia  HPI Alison Reyes is a 33 y.o. female with a past medical history listed below including patent PFO, intermittent tachycardia, polysubstance use disorder here for tachycardia noted to have rates in the 170s by EMS.  Patient is shaky and not answering questions.  Only shaking her head yes and no to questions.  On review of record, patient was admitted to [facility] health on December 22 for similar episode and had extensive workup noting the patent PFO with mild left-to-right shunting.  No thrombus noted.  Patient also ruled out for PE.  It did show that patient was positive for cocaine, opiates, THC.  Patient denies any cocaine or THC use today.  HPI  Past Medical History Past Medical History:  Diagnosis Date  . Anxiety   . Congenital heart defect    There are no active problems to display for this patient.  Home Medication(s) Prior to Admission medications   Medication Sig Start Date End Date Taking? Authorizing Provider  diclofenac Sodium (VOLTAREN) 1 % GEL Apply 4 g topically 4 (four) times daily. 02/06/23   Palumbo, April, MD  EPINEPHrine 0.3 mg/0.3 mL IJ SOAJ injection Inject 0.3 mg into the muscle as needed for anaphylaxis. 04/23/20   Mardella Layman, MD  lidocaine (LIDODERM) 5 % Place 1 patch onto the skin daily. Remove & Discard patch within 12 hours or as directed by MD 02/06/23   Nicanor Alcon, April, MD  ondansetron (ZOFRAN-ODT) 8 MG disintegrating tablet Take 1 tablet (8 mg total) by mouth every 8 (eight) hours as needed. 12/23/22   Zadie Rhine, MD  diphenhydrAMINE (BENADRYL) 25 MG tablet Take 25 mg by mouth as needed (for allergic reactions).   01/22/20  [provider]                                                                                                                                     Allergies Benzocaine, Shellfish allergy, Benzocaine-benzalkonium cl, Carbamide peroxide, Prednisone, and Nickel  Review of Systems Review of Systems As noted in HPI  Physical Exam Vital Signs  I have reviewed the triage vital signs There were no vitals taken for this visit. *** Physical Exam Vitals reviewed.  Constitutional:      General: She is not in acute distress.    Appearance: She is well-developed. She is not diaphoretic.  HENT:     Head: Normocephalic and atraumatic.     Nose: Nose normal.  Eyes:     General: No scleral icterus.       Right eye: No discharge.        Left eye: No discharge.     Conjunctiva/sclera: Conjunctivae normal.     Pupils: Pupils are equal, round, and reactive to light.  Cardiovascular:     Rate and Rhythm: Regular rhythm.  Tachycardia present.     Heart sounds: No murmur heard.    No friction rub. No gallop.  Pulmonary:     Effort: Pulmonary effort is normal. No respiratory distress.     Breath sounds: Normal breath sounds. No stridor. No rales.  Abdominal:     General: There is no distension.     Palpations: Abdomen is soft.     Tenderness: There is no abdominal tenderness.  Musculoskeletal:        General: No tenderness.     Cervical back: Normal range of motion and neck supple.     Right lower leg: No edema.     Left lower leg: No edema.  Skin:    General: Skin is warm and dry.     Findings: No erythema or rash.  Neurological:     Mental Status: She is alert and oriented to person, place, and time.     ED Results and Treatments Labs (all labs ordered are listed, but only abnormal results are displayed) Labs Reviewed  CBC - Abnormal; Notable for the following components:      Result Value   WBC 12.5 (*)    All other components within normal limits  BASIC METABOLIC PANEL  RAPID URINE DRUG SCREEN, HOSP PERFORMED  TROPONIN I (HIGH SENSITIVITY)                                                                                                                          EKG  EKG Interpretation Date/Time:    Ventricular Rate:    PR Interval:    QRS Duration:    QT Interval:    QTC Calculation:   R Axis:      Text Interpretation:         Radiology DG Chest Port 1 View Result Date: 03/28/2023 CLINICAL DATA:  Tachycardia. EXAM: PORTABLE CHEST 1 VIEW COMPARISON:  02/06/2023 FINDINGS: The cardiomediastinal contours are normal. The lungs are clear. Pulmonary vasculature is normal. No consolidation, pleural effusion, or pneumothorax. No acute osseous abnormalities are seen. IMPRESSION: Clear lungs.  No acute findings. Electronically Signed   By: Narda Rutherford M.D.   On: 03/28/2023 23:54    Medications Ordered in ED Medications  sodium chloride 0.9 % bolus 1,000 mL (has no administration in time range)   Procedures Procedures  (including critical care time) Medical Decision Making / ED Course   Medical Decision Making Amount and/or Complexity of Data Reviewed Labs: ordered. Decision-making details documented in ED Course. Radiology: ordered and independent interpretation performed. Decision-making details documented in ED Course. ECG/medicine tests: ordered and independent interpretation performed. Decision-making details documented in ED Course.    Patient presents with sinus tachycardia.  Noted to have rates in the 170s to 180s.  Thought to be SVT on monitor.  On telemetry after patient was brought back to a treatment room, her heart rate was in the 130s to 150s and obvious sinus tachycardia.  No other dysrhythmias.  ***  Final Clinical Impression(s) / ED Diagnoses Final diagnoses:  None    This chart was dictated using voice recognition software.  Despite best efforts to proofread,  errors can occur which can change the documentation meaning.

## 2023-03-28 NOTE — ED Notes (Signed)
[facility] sent to lab

## 2023-03-28 NOTE — ED Provider Triage Note (Signed)
Emergency Medicine Provider Triage Evaluation Note  YELENA METZER , a 33 y.o. female  was evaluated in triage.  Patient is unable to tell me why she is here.  Patient's heart rate is 171 does appear to be in SVT.  Patient cannot tell me if she is having any chest pain or used any drugs or what may have caused her symptoms.  Review of Systems  Positive:  Negative:   Physical Exam  There were no vitals taken for this visit. Gen:   Awake, in distress Resp:  Normal effort  MSK:   Whole body tremors Other:  Tachycardia 171, pupils PERRL however they are dilated  Medical Decision Making  Medically screening exam initiated at 11:33 PM.  Appropriate orders placed.  KENADI MILTNER was informed that the remainder of the evaluation will be completed by another provider, this initial triage assessment does not replace that evaluation, and the importance of remaining in the ED until their evaluation is complete.  Workup initiated, attending notified, labs and imaging ordered, will order Roby Lofts is on the monitor does appear she is in SVT   Netta Corrigan, [place] 03/28/23 2334

## 2023-03-29 LAB — BASIC METABOLIC PANEL
Anion gap: 12 (ref 5–15)
BUN: 10 mg/dL (ref 6–20)
CO2: 22 mmol/L (ref 22–32)
Calcium: 9.7 mg/dL (ref 8.9–10.3)
Chloride: 98 mmol/L (ref 98–111)
Creatinine, Ser: 0.8 mg/dL (ref 0.44–1.00)
GFR, Estimated: >60 mL/min (ref >60)
Glucose, Bld: 151 mg/dL — ABNORMAL HIGH (ref 70–99)
Potassium: 3.6 mmol/L (ref 3.5–5.1)
Sodium: 132 mmol/L — ABNORMAL LOW (ref 135–145)

## 2023-03-29 LAB — RAPID URINE DRUG SCREEN, HOSP PERFORMED
Amphetamines: NOT DETECTED
Barbiturates: NOT DETECTED
Benzodiazepines: NOT DETECTED
Cocaine: POSITIVE — AB
Opiates: NOT DETECTED
Tetrahydrocannabinol: POSITIVE — AB

## 2023-03-29 LAB — TROPONIN I (HIGH SENSITIVITY)
Troponin I (High Sensitivity): 2 ng/L (ref <18)
Troponin I (High Sensitivity): 3 ng/L (ref <18)

## 2023-03-29 MED ORDER — FAMOTIDINE IN NACL 20-0.9 MG/50ML-% IV SOLN
20.0000 mg | Freq: Once | INTRAVENOUS | Status: AC
  Administered 2023-03-29: 20 mg via INTRAVENOUS
  Filled 2023-03-29: qty 50

## 2023-03-29 MED ORDER — DIPHENHYDRAMINE HCL 50 MG/ML IJ SOLN
25.0000 mg | Freq: Once | INTRAMUSCULAR | Status: AC
  Administered 2023-03-29: 25 mg via INTRAVENOUS
  Filled 2023-03-29: qty 1

## 2023-03-29 MED ORDER — SODIUM CHLORIDE 0.9 % IV BOLUS
1000.0000 mL | Freq: Once | INTRAVENOUS | Status: AC
  Administered 2023-03-29: 1000 mL via INTRAVENOUS

## 2024-03-17 ENCOUNTER — Other Ambulatory Visit: Payer: Self-pay

## 2024-03-17 ENCOUNTER — Emergency Department (HOSPITAL_COMMUNITY)
Admission: EM | Admit: 2024-03-17 | Discharge: 2024-03-17 | Disposition: A | Discharge status: Home / Self Care | Payer: Self-pay | Attending: Emergency Medicine | Admitting: Emergency Medicine

## 2024-03-17 DIAGNOSIS — F1193 Opioid use, unspecified with withdrawal: Secondary | ICD-10-CM | POA: Insufficient documentation

## 2024-03-17 LAB — LIPASE, BLOOD: Lipase: 15 U/L (ref 11–51)

## 2024-03-17 LAB — CBC WITH DIFFERENTIAL/PLATELET
Abs Immature Granulocytes: 0.07 K/uL (ref 0.00–0.07)
Basophils Absolute: 0 K/uL (ref 0.0–0.1)
Basophils Relative: 0 %
Eosinophils Absolute: 0 K/uL (ref 0.0–0.5)
Eosinophils Relative: 0 %
HCT: 38 % (ref 36.0–46.0)
Hemoglobin: 13 g/dL (ref 12.0–15.0)
Immature Granulocytes: 1 %
Lymphocytes Relative: 16 %
Lymphs Abs: 1.5 K/uL (ref 0.7–4.0)
MCH: 31.6 pg (ref 26.0–34.0)
MCHC: 34.2 g/dL (ref 30.0–36.0)
MCV: 92.5 fL (ref 80.0–100.0)
Monocytes Absolute: 0.3 K/uL (ref 0.1–1.0)
Monocytes Relative: 4 %
Neutro Abs: 7.4 K/uL (ref 1.7–7.7)
Neutrophils Relative %: 79 %
Platelets: 445 K/uL — ABNORMAL HIGH (ref 150–400)
RBC: 4.11 MIL/uL (ref 3.87–5.11)
RDW: 13.3 % (ref 11.5–15.5)
WBC: 9.3 K/uL (ref 4.0–10.5)
nRBC: 0 % (ref 0.0–0.2)

## 2024-03-17 LAB — COMPREHENSIVE METABOLIC PANEL WITH GFR
ALT: 66 U/L — ABNORMAL HIGH (ref 0–44)
AST: 34 U/L (ref 15–41)
Albumin: 4.6 g/dL (ref 3.5–5.0)
Alkaline Phosphatase: 70 U/L (ref 38–126)
Anion gap: 14 (ref 5–15)
BUN: 7 mg/dL (ref 6–20)
CO2: 22 mmol/L (ref 22–32)
Calcium: 9.7 mg/dL (ref 8.9–10.3)
Chloride: 103 mmol/L (ref 98–111)
Creatinine, Ser: 0.74 mg/dL (ref 0.44–1.00)
GFR, Estimated: >60 mL/min
Glucose, Bld: 120 mg/dL — ABNORMAL HIGH (ref 70–99)
Potassium: 3.8 mmol/L (ref 3.5–5.1)
Sodium: 139 mmol/L (ref 135–145)
Total Bilirubin: 0.6 mg/dL (ref 0.0–1.2)
Total Protein: 7.9 g/dL (ref 6.5–8.1)

## 2024-03-17 MED ORDER — ONDANSETRON HCL 4 MG/2ML IJ SOLN
4.0000 mg | Freq: Once | INTRAMUSCULAR | Status: AC
  Administered 2024-03-17: 4 mg via INTRAVENOUS
  Filled 2024-03-17: qty 2

## 2024-03-17 MED ORDER — KETOROLAC TROMETHAMINE 30 MG/ML IJ SOLN
15.0000 mg | Freq: Once | INTRAMUSCULAR | Status: AC
  Administered 2024-03-17: 15 mg via INTRAVENOUS
  Filled 2024-03-17: qty 1

## 2024-03-17 MED ORDER — IBUPROFEN 600 MG PO TABS: 600.0000 mg | ORAL_TABLET | Freq: Four times a day (QID) | ORAL | 0 refills | Status: AC | PRN

## 2024-03-17 MED ORDER — BUPRENORPHINE HCL 0.3 MG/ML IJ SOLN: 0.3000 mg | Freq: Once | INTRAMUSCULAR | Status: DC

## 2024-03-17 MED ORDER — ONDANSETRON HCL 4 MG PO TABS: 4.0000 mg | ORAL_TABLET | Freq: Four times a day (QID) | ORAL | 0 refills | Status: AC

## 2024-03-17 MED ORDER — ONDANSETRON 4 MG PO TBDP: 4.0000 mg | ORAL_TABLET | Freq: Once | ORAL | Status: DC

## 2024-03-17 MED ORDER — HYDROXYZINE HCL 25 MG PO TABS
25.0000 mg | ORAL_TABLET | Freq: Once | ORAL | Status: AC
  Administered 2024-03-17: 25 mg via ORAL
  Filled 2024-03-17: qty 1

## 2024-03-17 MED ORDER — HYDROXYZINE HCL 25 MG PO TABS: 25.0000 mg | ORAL_TABLET | Freq: Four times a day (QID) | ORAL | 0 refills | Status: AC

## 2024-03-17 MED ORDER — SODIUM CHLORIDE 0.9 % IV BOLUS
1000.0000 mL | Freq: Once | INTRAVENOUS | Status: AC
  Administered 2024-03-17: 1000 mL via INTRAVENOUS

## 2024-03-17 MED ORDER — BUPRENORPHINE HCL 0.3 MG/ML IJ SOLN
0.3000 mg | Freq: Once | INTRAMUSCULAR | Status: AC
  Administered 2024-03-17: 0.3 mg via INTRAVENOUS

## 2024-03-17 NOTE — ED Provider Notes (Signed)
 " [place] [facility] AT [facility] Provider Note   CSN: [id number] Arrival date & time: 03/17/24  8042     Patient presents with: Withdrawal   Alison Reyes is a 34 y.o. female.with h/o patent PFO, tachycardia, polysubstance use disorder presents with withdrawal from fentanyl. Has been using intranasal fentanyl for a few months, last used 2 days ago and now has body aches and vomiting, cannot eat or sleep, needs help with symptoms. Used to use cocaine and marijuana but stopped.     Prior to Admission medications  Medication Sig Start Date End Date Taking? Authorizing Provider  hydrOXYzine  (ATARAX ) 25 MG tablet Take 1 tablet (25 mg total) by mouth every 6 (six) hours. 03/17/24  Yes Marget Outten Hima, MD  ibuprofen  (ADVIL ) 600 MG tablet Take 1 tablet (600 mg total) by mouth every 6 (six) hours as needed. 03/17/24  Yes Otto Felkins Hima, MD  ondansetron  (ZOFRAN ) 4 MG tablet Take 1 tablet (4 mg total) by mouth every 6 (six) hours. 03/17/24  Yes Joycie Aerts Hima, MD  diclofenac  Sodium (VOLTAREN ) 1 % GEL Apply 4 g topically 4 (four) times daily. 02/06/23   Palumbo, April, MD  EPINEPHrine  0.3 mg/0.3 mL IJ SOAJ injection Inject 0.3 mg into the muscle as needed for anaphylaxis. 04/23/20   Rolinda Rogue, MD  lidocaine  (LIDODERM ) 5 % Place 1 patch onto the skin daily. Remove & Discard patch within 12 hours or as directed by MD 02/06/23   Nettie, April, MD  ondansetron  (ZOFRAN -ODT) 8 MG disintegrating tablet Take 1 tablet (8 mg total) by mouth every 8 (eight) hours as needed. 12/23/22   Midge Golas, MD  diphenhydrAMINE  (BENADRYL ) 25 MG tablet Take 25 mg by mouth as needed (for allergic reactions).   01/22/20  [provider]    Allergies: Benzocaine, Shellfish allergy, Benzocaine-benzalkonium cl, Carbamide peroxide, Prednisone, and Nickel    Review of Systems +n/v Updated Vital Signs BP 135/84 (BP Location: Left Arm)   Pulse (!) 59   Temp 98.1 F  (36.7 C) (Oral)   Resp 17   Ht 5' 6 (1.676 m)   Wt 59 kg   SpO2 98%   BMI 20.98 kg/m   Physical Exam  Constitutional: Patient appears well-developed and well-nourished. No distress.  HENT:  Head: Normocephalic and atraumatic.  Mouth/Throat: Oropharynx is clear and moist. No oropharyngeal exudate.  Eyes: Conjunctivae are normal. Pupils are equal, round, and reactive to light. Neck: Normal range of motion. Neck supple.  Cardiovascular: Normal rate, regular rhythm, normal heart sounds and intact distal pulses.   Pulmonary/Chest: Effort normal and breath sounds normal. No respiratory distress. No wheezes or rales.  Abdominal: Soft. Bowel sounds are normal. No distension or tenderness.  Musculoskeletal: Normal range of motion. No edema or tenderness.  Neurological: Patient is alert and oriented.  No facial droop.  Clear speech.  Normal strength and sensation throughout.  Normal coordination. Skin: Skin is warm and dry. No diaphoresis.  Psychiatric: Normal mood and affect. Normal behavior. Judgment and thought content normal.  Nursing note and vitals reviewed.  (all labs ordered are listed, but only abnormal results are displayed) Labs Reviewed  COMPREHENSIVE METABOLIC PANEL WITH GFR - Abnormal; Notable for the following components:      Result Value   Glucose, Bld 120 (*)    ALT 66 (*)    All other components within normal limits  CBC WITH DIFFERENTIAL/PLATELET - Abnormal; Notable for the following components:   Platelets 445 (*)  All other components within normal limits  LIPASE, BLOOD  PREGNANCY, URINE  URINE DRUG SCREEN    EKG: None  Radiology: No results found.  This patient presents to the ED with chief complaint(s) of withdrawal with pertinent past medical history of polysubstance use disorder. The complaint involves an extensive differential diagnosis and also carries with it a high risk of complications and morbidity.    Additional history obtained from none. I  have also reviewed prior visits  The differential diagnosis includes gastroenteritis, opiate withdrawal, gastritis, cannabinoid hyperemesis  The initial management included IV, labs, fluids, buprenorphine , hydroxyzine , Zofran     Reassessments:  The following labs were independently interpreted: CBC and CMP are reassuring.  Urinalysis is pending but patient would like to leave.  Denies chance of pregnancy as she does not have female sexual partners.  Denies any urinary symptoms.    Treatment and Reassessment: Patient feeling much better after treatment here and would like to go home.  Will provide medication for symptomatic management and referral for outpatient substance use disorder resources.  No suicidal or homicidal ideation, no acute medical illness to warrant further workup or admission or psychiatric consultation.  Consultation: - Consulted or discussed management/test interpretation with external professional: None  Consideration for admission or further workup: Not indicated  Social Determinants of health: Substance use disorder  Final diagnoses:  Narcotic withdrawal [facility])    ED Discharge Orders          Ordered    ondansetron  (ZOFRAN ) 4 MG tablet  Every 6 hours        03/17/24 2234    hydrOXYzine  (ATARAX ) 25 MG tablet  Every 6 hours        03/17/24 2234    ibuprofen  (ADVIL ) 600 MG tablet  Every 6 hours PRN        03/17/24 2234               Yareth Macdonnell Hima, MD 03/17/24 2247  "

## 2024-03-17 NOTE — ED Triage Notes (Signed)
 BIBA from home says she's been detox from fentanyl.  C/o generalized abdominal pain, N/V.  1 episode prior to EMS arrival.  Looking for help detoxing and willing to speak to a councilor   136/88 60 16 98% 115

## 2024-03-17 NOTE — Discharge Instructions (Addendum)
 You were seen in the emergency department for withdrawal symptoms from fentanyl.  Your evaluation is reassuring and I have prescribed medications that will help you with your symptoms.  Please continue to abstain from any recreational drug use.  See attached handout for information on outpatient resources.
